# Patient Record
Sex: Male | Born: 1990 | Race: Black or African American | Hispanic: No | Marital: Married | Smoking: Never smoker
Health system: Southern US, Community
[De-identification: ages and names within clinical notes are randomized; demographics above are authoritative.]

## PROBLEM LIST (undated history)

## (undated) DIAGNOSIS — Z789 Other specified health status: Secondary | ICD-10-CM

## (undated) HISTORY — DX: Morbid (severe) obesity due to excess calories: E66.01

## (undated) HISTORY — DX: Other specified health status: Z78.9

---
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
## 2009-05-24 ENCOUNTER — Emergency Department (HOSPITAL_COMMUNITY): Admission: EM | Admit: 2009-05-24 | Discharge: 2009-05-25 | Payer: Self-pay | Admitting: Emergency Medicine

## 2009-05-26 ENCOUNTER — Emergency Department (HOSPITAL_COMMUNITY): Admission: EM | Admit: 2009-05-26 | Discharge: 2009-05-26 | Payer: Self-pay | Admitting: Emergency Medicine

## 2010-03-31 ENCOUNTER — Encounter: Admission: RE | Admit: 2010-03-31 | Discharge: 2010-03-31 | Payer: Self-pay | Admitting: General Practice

## 2010-05-03 IMAGING — CR DG CHEST 1V PORT
1 series · 1 of 1 positions shown · non-contrast
Comparison: Chest radiograph 06/08/2006

CLINICAL DATA: Pain and palpitations

PORTABLE CHEST - 1 VIEW

[view not recorded]
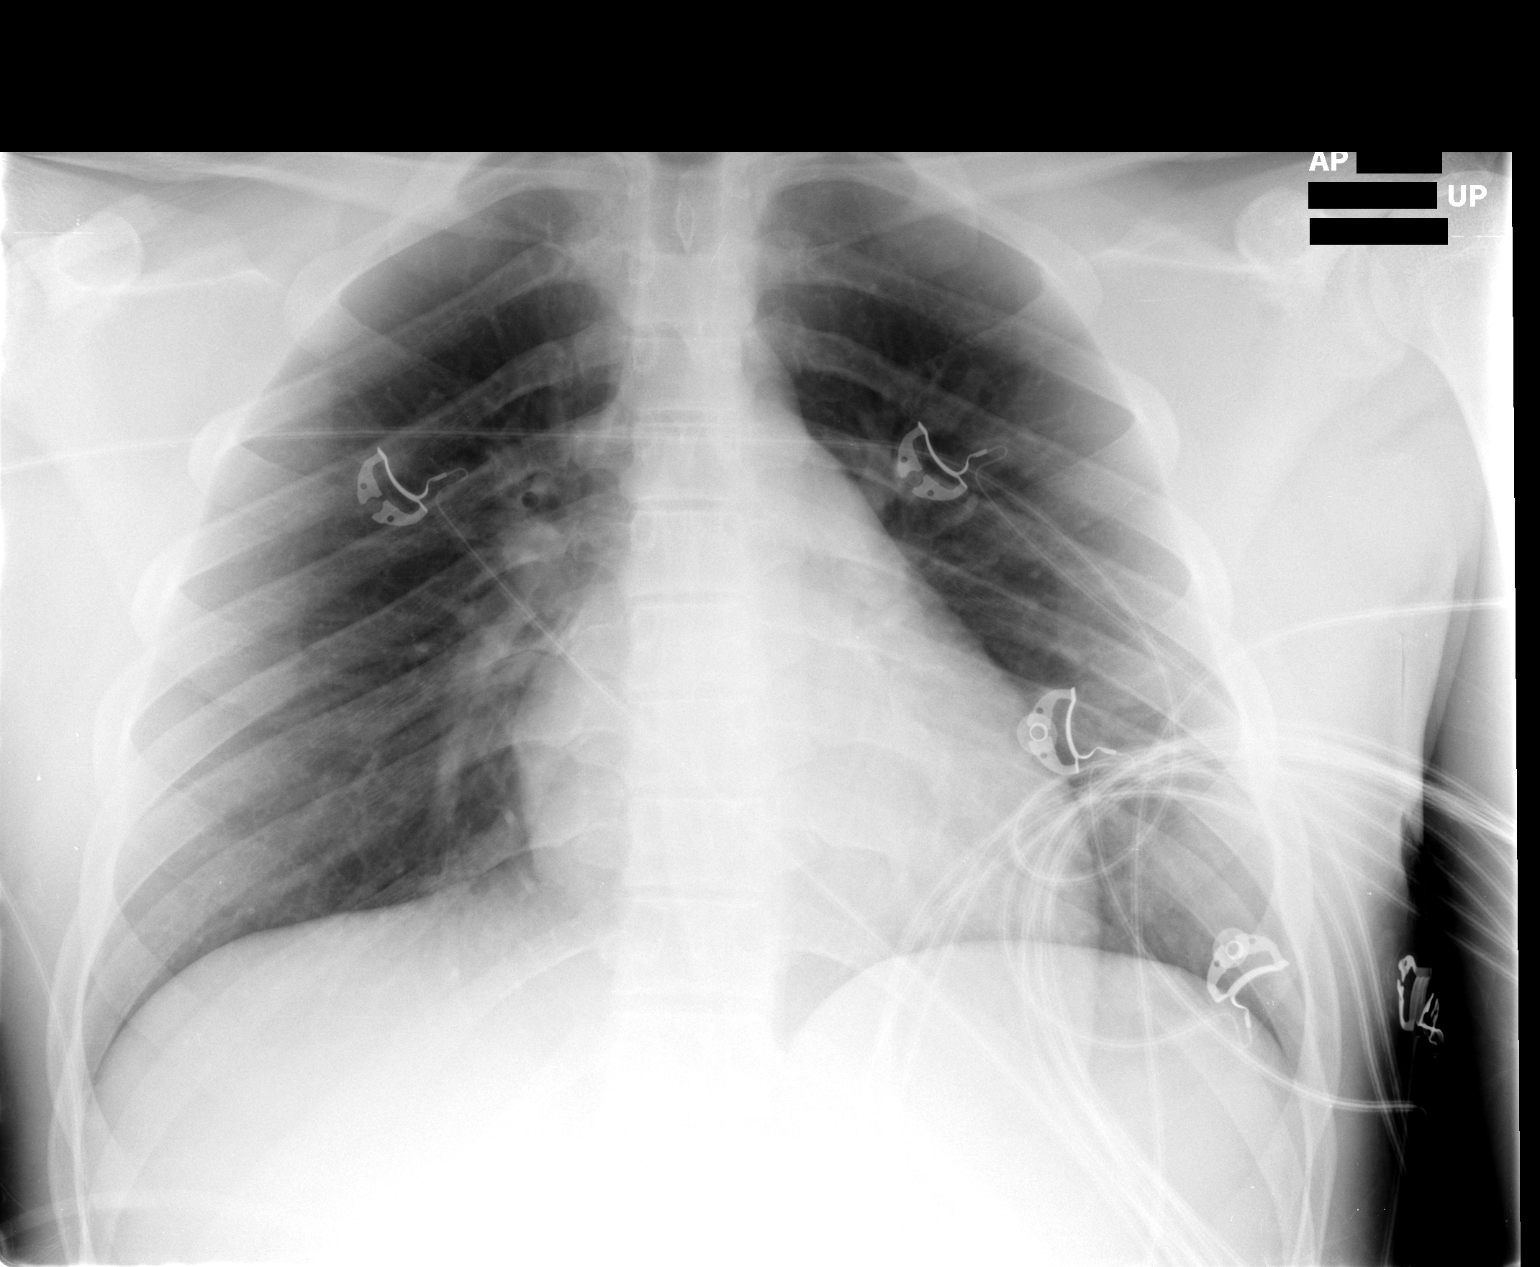

[1 of 1 positions shown; findings below may reference images not displayed]

FINDINGS: Normal mediastinum and cardiac silhouette.  Costophrenic
angles are clear.  No evidence effusion, infiltrate, or
pneumothorax.
IMPRESSION: No acute cardiopulmonary process.

## 2010-11-06 LAB — URINE CULTURE
Colony Count: NO GROWTH
Culture: NO GROWTH

## 2010-11-06 LAB — DIFFERENTIAL
Basophils Absolute: 0 10*3/uL (ref 0.0–0.1)
Basophils Relative: 0 % (ref 0–1)
Eosinophils Absolute: 0 10*3/uL (ref 0.0–0.7)
Eosinophils Relative: 0 % (ref 0–5)
Lymphocytes Relative: 28 % (ref 12–46)
Lymphs Abs: 2.2 10*3/uL (ref 0.7–4.0)
Monocytes Absolute: 0.7 10*3/uL (ref 0.1–1.0)
Monocytes Relative: 9 % (ref 3–12)
Neutro Abs: 5 10*3/uL (ref 1.7–7.7)
Neutrophils Relative %: 63 % (ref 43–77)

## 2010-11-06 LAB — URINALYSIS, ROUTINE W REFLEX MICROSCOPIC
Bilirubin Urine: NEGATIVE
Glucose, UA: NEGATIVE mg/dL
Hgb urine dipstick: NEGATIVE
Ketones, ur: NEGATIVE mg/dL
Ketones, ur: NEGATIVE mg/dL
Protein, ur: NEGATIVE mg/dL
Specific Gravity, Urine: 1.025 (ref 1.005–1.030)
Urobilinogen, UA: 1 mg/dL (ref 0.0–1.0)
pH: 7 (ref 5.0–8.0)

## 2010-11-06 LAB — COMPREHENSIVE METABOLIC PANEL
ALT: 24 U/L (ref 0–53)
Alkaline Phosphatase: 70 U/L (ref 39–117)
BUN: 8 mg/dL (ref 6–23)
Calcium: 9.1 mg/dL (ref 8.4–10.5)
GFR calc Af Amer: >60 mL/min (ref >60)
Glucose, Bld: 179 mg/dL — ABNORMAL HIGH (ref 70–99)
Potassium: 3 mEq/L — ABNORMAL LOW (ref 3.5–5.1)
Total Bilirubin: 0.5 mg/dL (ref 0.3–1.2)
Total Protein: 7.2 g/dL (ref 6.0–8.3)

## 2010-11-06 LAB — CBC
HCT: 42.1 % (ref 39.0–52.0)
Hemoglobin: 14.2 g/dL (ref 13.0–17.0)
MCHC: 33.7 g/dL (ref 30.0–36.0)
MCV: 86.8 fL (ref 78.0–100.0)
Platelets: 171 10*3/uL (ref 150–400)
RBC: 4.86 MIL/uL (ref 4.22–5.81)
RDW: 13.5 % (ref 11.5–15.5)
WBC: 7.9 10*3/uL (ref 4.0–10.5)

## 2010-11-06 LAB — POCT I-STAT, CHEM 8
BUN: 9 mg/dL (ref 6–23)
Calcium, Ion: 1.2 mmol/L (ref 1.12–1.32)
Chloride: 102 mEq/L (ref 96–112)
Creatinine, Ser: 1.2 mg/dL (ref 0.4–1.5)
Glucose, Bld: 93 mg/dL (ref 70–99)
HCT: 45 % (ref 39.0–52.0)
Hemoglobin: 15.3 g/dL (ref 13.0–17.0)
Potassium: 4 mEq/L (ref 3.5–5.1)
Sodium: 140 mEq/L (ref 135–145)
TCO2: 27 mmol/L (ref 0–100)

## 2010-11-06 LAB — RAPID URINE DRUG SCREEN, HOSP PERFORMED
Amphetamines: NOT DETECTED
Barbiturates: NOT DETECTED
Benzodiazepines: NOT DETECTED
Benzodiazepines: NOT DETECTED
Cocaine: NOT DETECTED
Cocaine: NOT DETECTED
Opiates: NOT DETECTED
Tetrahydrocannabinol: NOT DETECTED
Tetrahydrocannabinol: NOT DETECTED

## 2010-11-06 LAB — POCT CARDIAC MARKERS
CKMB, poc: <1 ng/mL — ABNORMAL LOW (ref 1.0–8.0)
CKMB, poc: <1 ng/mL — ABNORMAL LOW (ref 1.0–8.0)
CKMB, poc: <1 ng/mL — ABNORMAL LOW (ref 1.0–8.0)
Myoglobin, poc: 42.1 ng/mL (ref 12–200)
Myoglobin, poc: 51.5 ng/mL (ref 12–200)
Myoglobin, poc: 54.6 ng/mL (ref 12–200)
Troponin i, poc: <0.05 ng/mL (ref 0.00–0.09)
Troponin i, poc: <0.05 ng/mL (ref 0.00–0.09)
Troponin i, poc: <0.05 ng/mL (ref 0.00–0.09)

## 2010-11-06 LAB — ACETAMINOPHEN LEVEL: Acetaminophen (Tylenol), Serum: <10 ug/mL — ABNORMAL LOW (ref 10–30)

## 2010-11-06 LAB — ETHANOL: Alcohol, Ethyl (B): 57 mg/dL — ABNORMAL HIGH (ref 0–10)

## 2010-11-24 ENCOUNTER — Emergency Department (HOSPITAL_COMMUNITY)
Admission: EM | Admit: 2010-11-24 | Discharge: 2010-11-24 | Disposition: A | Discharge status: Home / Self Care | Payer: Self-pay | Attending: Emergency Medicine | Admitting: Emergency Medicine

## 2010-11-24 DIAGNOSIS — N342 Other urethritis: Secondary | ICD-10-CM | POA: Insufficient documentation

## 2010-11-24 DIAGNOSIS — R109 Unspecified abdominal pain: Secondary | ICD-10-CM | POA: Insufficient documentation

## 2010-11-24 DIAGNOSIS — R300 Dysuria: Secondary | ICD-10-CM | POA: Insufficient documentation

## 2010-11-24 LAB — URINALYSIS, ROUTINE W REFLEX MICROSCOPIC
Bilirubin Urine: NEGATIVE
Glucose, UA: NEGATIVE mg/dL
Ketones, ur: NEGATIVE mg/dL
Nitrite: NEGATIVE
Nitrite: NEGATIVE
Specific Gravity, Urine: 1.029 (ref 1.005–1.030)
Urobilinogen, UA: 0.2 mg/dL (ref 0.0–1.0)
pH: 6 (ref 5.0–8.0)
pH: 6 (ref 5.0–8.0)

## 2010-11-25 LAB — GC/CHLAMYDIA PROBE AMP, GENITAL: Chlamydia, DNA Probe: NEGATIVE

## 2010-11-29 ENCOUNTER — Emergency Department (HOSPITAL_COMMUNITY)
Admission: EM | Admit: 2010-11-29 | Discharge: 2010-11-29 | Disposition: A | Discharge status: Home / Self Care | Payer: Self-pay | Attending: Emergency Medicine | Admitting: Emergency Medicine

## 2010-11-29 DIAGNOSIS — R0989 Other specified symptoms and signs involving the circulatory and respiratory systems: Secondary | ICD-10-CM | POA: Insufficient documentation

## 2010-11-29 DIAGNOSIS — R0602 Shortness of breath: Secondary | ICD-10-CM | POA: Insufficient documentation

## 2010-11-29 DIAGNOSIS — Y929 Unspecified place or not applicable: Secondary | ICD-10-CM | POA: Insufficient documentation

## 2010-11-29 DIAGNOSIS — T370X5A Adverse effect of sulfonamides, initial encounter: Secondary | ICD-10-CM | POA: Insufficient documentation

## 2010-11-29 DIAGNOSIS — R0609 Other forms of dyspnea: Secondary | ICD-10-CM | POA: Insufficient documentation

## 2010-11-29 DIAGNOSIS — K117 Disturbances of salivary secretion: Secondary | ICD-10-CM | POA: Insufficient documentation

## 2011-01-28 ENCOUNTER — Emergency Department (HOSPITAL_COMMUNITY)
Admission: EM | Admit: 2011-01-28 | Discharge: 2011-01-28 | Disposition: A | Discharge status: Home / Self Care | Payer: Private Health Insurance - Indemnity | Attending: Emergency Medicine | Admitting: Emergency Medicine

## 2011-01-28 DIAGNOSIS — R195 Other fecal abnormalities: Secondary | ICD-10-CM | POA: Insufficient documentation

## 2011-01-28 DIAGNOSIS — K625 Hemorrhage of anus and rectum: Secondary | ICD-10-CM | POA: Insufficient documentation

## 2011-01-28 DIAGNOSIS — R1084 Generalized abdominal pain: Secondary | ICD-10-CM | POA: Insufficient documentation

## 2011-01-28 LAB — POCT I-STAT, CHEM 8
BUN: 10 mg/dL (ref 6–23)
Calcium, Ion: 1.25 mmol/L (ref 1.12–1.32)
Chloride: 103 mEq/L (ref 96–112)
Creatinine, Ser: 1.2 mg/dL (ref 0.50–1.35)
Glucose, Bld: 93 mg/dL (ref 70–99)
HCT: 51 % (ref 39.0–52.0)
Hemoglobin: 17.3 g/dL — ABNORMAL HIGH (ref 13.0–17.0)
TCO2: 29 mmol/L (ref 0–100)

## 2011-01-28 LAB — DIFFERENTIAL
Basophils Absolute: 0 10*3/uL (ref 0.0–0.1)
Basophils Relative: 0 % (ref 0–1)
Eosinophils Relative: 1 % (ref 0–5)
Monocytes Relative: 10 % (ref 3–12)
Neutro Abs: 4.7 10*3/uL (ref 1.7–7.7)
Neutrophils Relative %: 65 % (ref 43–77)

## 2011-01-28 LAB — URINALYSIS, ROUTINE W REFLEX MICROSCOPIC
Bilirubin Urine: NEGATIVE
Glucose, UA: NEGATIVE mg/dL
Leukocytes, UA: NEGATIVE
Specific Gravity, Urine: 1.023 (ref 1.005–1.030)
pH: 6.5 (ref 5.0–8.0)

## 2011-01-28 LAB — POTASSIUM: Potassium: 5.3 mEq/L — ABNORMAL HIGH (ref 3.5–5.1)

## 2011-01-28 LAB — CBC
MCH: 28.5 pg (ref 26.0–34.0)
MCHC: 33.9 g/dL (ref 30.0–36.0)
MCV: 84.2 fL (ref 78.0–100.0)
WBC: 7.1 10*3/uL (ref 4.0–10.5)

## 2011-01-28 LAB — OCCULT BLOOD, POC DEVICE: Fecal Occult Bld: NEGATIVE

## 2011-02-10 ENCOUNTER — Inpatient Hospital Stay (INDEPENDENT_AMBULATORY_CARE_PROVIDER_SITE_OTHER)
Admission: RE | Admit: 2011-02-10 | Discharge: 2011-02-10 | Disposition: A | Discharge status: Home / Self Care | Payer: 59 | Source: Ambulatory Visit | Attending: Emergency Medicine | Admitting: Emergency Medicine

## 2011-02-10 DIAGNOSIS — R6889 Other general symptoms and signs: Secondary | ICD-10-CM

## 2011-02-10 LAB — POCT I-STAT, CHEM 8
Creatinine, Ser: 1.2 mg/dL (ref 0.50–1.35)
Hemoglobin: 16.3 g/dL (ref 13.0–17.0)
Sodium: 140 mEq/L (ref 135–145)
TCO2: 28 mmol/L (ref 0–100)

## 2011-08-18 ENCOUNTER — Encounter (HOSPITAL_COMMUNITY): Payer: Self-pay | Admitting: Emergency Medicine

## 2011-08-18 ENCOUNTER — Emergency Department (HOSPITAL_COMMUNITY)
Admission: EM | Admit: 2011-08-18 | Discharge: 2011-08-18 | Disposition: A | Discharge status: Home / Self Care | Payer: 59 | Source: Home / Self Care | Attending: Family Medicine | Admitting: Family Medicine

## 2011-08-18 DIAGNOSIS — J029 Acute pharyngitis, unspecified: Secondary | ICD-10-CM

## 2011-08-18 DIAGNOSIS — R05 Cough: Secondary | ICD-10-CM

## 2011-08-18 DIAGNOSIS — R059 Cough, unspecified: Secondary | ICD-10-CM

## 2011-08-18 NOTE — ED Notes (Signed)
Sore throat, cough.  Onset 2 days ago

## 2011-08-18 NOTE — ED Provider Notes (Signed)
History     CSN: [id number]  Arrival date & time 08/18/11  1433   First MD Initiated Contact with Patient 08/18/11 1436      Chief Complaint  Patient presents with  . URI    (Consider location/radiation/quality/duration/timing/severity/associated sxs/prior treatment) Patient is a 21 y.o. male presenting with cough. The history is provided by the patient.  Cough This is a new problem. The current episode started more than 2 days ago. The problem occurs constantly. The problem has not changed since onset.The cough is non-productive. There has been no fever. Associated symptoms include chills, sweats and sore throat. Pertinent negatives include no shortness of breath. He is not a smoker.    History reviewed. No pertinent past medical history.  History reviewed. No pertinent past surgical history.  No family history on file.  History  Substance Use Topics  . Smoking status: Never Smoker   . Smokeless tobacco: Not on file  . Alcohol Use: Yes      Review of Systems  Constitutional: Positive for chills.  HENT: Positive for congestion and sore throat. Negative for trouble swallowing.   Eyes: Negative.   Respiratory: Positive for cough. Negative for shortness of breath.   Cardiovascular: Negative.   Gastrointestinal: Negative.   Musculoskeletal: Negative.   Skin: Negative.     Allergies  Review of patient's allergies indicates no known allergies.  Home Medications  No current outpatient prescriptions on file.  BP 128/66  Pulse 79  Temp(Src) 97.7 F (36.5 C) (Oral)  Resp 14  SpO2 97%  Physical Exam  Nursing note and vitals reviewed. Constitutional: He is oriented to person, place, and time. He appears well-developed and well-nourished.  HENT:  Head: Normocephalic and atraumatic.  Right Ear: Tympanic membrane normal.  Left Ear: Tympanic membrane normal.  Mouth/Throat: Uvula is midline, oropharynx is clear and moist and mucous membranes are normal.  Eyes: EOM  are normal.  Neck: Normal range of motion.  Cardiovascular: Normal rate, regular rhythm and normal heart sounds.   Pulmonary/Chest: Effort normal and breath sounds normal. He has no wheezes. He has no rales.  Musculoskeletal: Normal range of motion.  Neurological: He is alert and oriented to person, place, and time.  Skin: Skin is warm and dry.  Psychiatric: His behavior is normal.    ED Course  Procedures (including critical care time)  Labs Reviewed - No data to display No results found.   1. Pharyngitis   2. Cough       MDM  Viral pharyngitis; 0/4 Centor criteria        Richardo Priest, MD 08/18/11 1650

## 2012-10-20 ENCOUNTER — Encounter (HOSPITAL_COMMUNITY): Payer: Self-pay | Admitting: Emergency Medicine

## 2012-10-20 ENCOUNTER — Emergency Department (HOSPITAL_COMMUNITY)
Admission: EM | Admit: 2012-10-20 | Discharge: 2012-10-20 | Disposition: A | Discharge status: Home / Self Care | Payer: No Typology Code available for payment source | Source: Home / Self Care | Attending: Family Medicine | Admitting: Family Medicine

## 2012-10-20 DIAGNOSIS — R197 Diarrhea, unspecified: Secondary | ICD-10-CM

## 2012-10-20 MED ORDER — LOPERAMIDE HCL 2 MG PO CAPS: 2.0000 mg | ORAL_CAPSULE | Freq: Four times a day (QID) | ORAL | Status: DC | PRN

## 2012-10-20 MED ORDER — ONDANSETRON 4 MG PO TBDP: 4.0000 mg | ORAL_TABLET | Freq: Three times a day (TID) | ORAL | Status: DC | PRN

## 2012-10-20 NOTE — ED Notes (Signed)
MD at bedside. 

## 2012-10-20 NOTE — ED Notes (Signed)
Information sheet: "diarrhea". Onset"Tuesday".  Tried "pepto bismal"

## 2012-10-20 NOTE — ED Provider Notes (Signed)
History     CSN: [id number]  Arrival date & time 10/20/12  1135   First MD Initiated Contact with Patient 10/20/12 1155      Chief Complaint  Patient presents with  . Diarrhea    (Consider location/radiation/quality/duration/timing/severity/associated sxs/prior treatment) HPI Comments: 22 year old male with no significant past medical history. Comes complaining of watery diarrhea for 2 days. Patient reports his symptoms were preceded by general malaise, headache and chills. Denies current fever. Has had nausea but no vomiting. Also reports decrease appetite. Denies abdominal pain. No cough or congestion. No chest pain. Diarrhea is nonbloody and no mucus. No rash. 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 Also has a young child at home that goes to daycare without current sickness. In   History reviewed. No pertinent past medical history.  History reviewed. No pertinent past surgical history.  No family history on file.  History  Substance Use Topics  . Smoking status: Never Smoker   . Smokeless tobacco: Not on file  . Alcohol Use: Yes      Review of Systems  Constitutional: Negative for fever, chills, diaphoresis, appetite change and fatigue.  HENT: Negative for congestion.   Respiratory: Negative for cough.   Gastrointestinal: Positive for diarrhea. Negative for vomiting and abdominal pain.  Skin: Negative for rash.  Neurological: Negative for dizziness and headaches.  All other systems reviewed and are negative.    Allergies  Sulfur  Home Medications   Current Outpatient Rx  Name  Route  Sig  Dispense  Refill  . bismuth subsalicylate (PEPTO BISMOL) 262 MG/15ML suspension   Oral   Take 15 mLs by mouth every 6 (six) hours as needed for indigestion.         Marland Kitchen loperamide (IMODIUM) 2 MG capsule   Oral   Take 1 capsule (2 mg total) by mouth 4 (four) times daily as needed for diarrhea or loose stools.  12 capsule   0   . ondansetron (ZOFRAN-ODT) 4 MG disintegrating tablet   Oral   Take 1 tablet (4 mg total) by mouth every 8 (eight) hours as needed for nausea.   10 tablet   0     BP 123/56  Pulse 66  Temp(Src) 97.9 F (36.6 C) (Oral)  Resp 16  SpO2 100%  Physical Exam  Nursing note and vitals reviewed. Constitutional: He is oriented to person, place, and time. He appears well-developed and well-nourished. No distress.  HENT:  Head: Normocephalic and atraumatic.  Mouth/Throat: Oropharynx is clear and moist. No oropharyngeal exudate.  Eyes: Conjunctivae are normal. No scleral icterus.  Neck: No thyromegaly present.  Cardiovascular: Normal heart sounds.   Pulmonary/Chest: Breath sounds normal.  Abdominal: Soft. Bowel sounds are normal. He exhibits no distension and no mass. There is no tenderness. There is no rebound and no guarding.  Lymphadenopathy:    He has no cervical adenopathy.  Neurological: He is alert and oriented to person, place, and time.  Skin: No rash noted. He is not diaphoretic.    ED Course  Procedures (including critical care time)  Labs Reviewed - No data to display No results found.   1. Diarrhea       MDM  Impress viral enteritis. No signs of dehydration. Clinically well. Reassuring abdominal exam. Prescribed loperamide, ondansetron. Supportive care including hydration and red flags that should prompt his return to medical attention discussed with patient and provided in writing.  Sharin Grave, MD 10/20/12 1757

## 2014-09-06 ENCOUNTER — Encounter (HOSPITAL_COMMUNITY): Payer: Self-pay | Admitting: Emergency Medicine

## 2014-09-06 ENCOUNTER — Emergency Department (INDEPENDENT_AMBULATORY_CARE_PROVIDER_SITE_OTHER)
Admission: EM | Admit: 2014-09-06 | Discharge: 2014-09-06 | Disposition: A | Discharge status: Home / Self Care | Payer: 59 | Source: Home / Self Care | Attending: Family Medicine | Admitting: Family Medicine

## 2014-09-06 DIAGNOSIS — Z23 Encounter for immunization: Secondary | ICD-10-CM

## 2014-09-06 DIAGNOSIS — L739 Follicular disorder, unspecified: Secondary | ICD-10-CM

## 2014-09-06 MED ORDER — TETANUS-DIPHTH-ACELL PERTUSSIS 5-2.5-18.5 LF-MCG/0.5 IM SUSP
0.5000 mL | Freq: Once | INTRAMUSCULAR | Status: AC
  Administered 2014-09-06: 0.5 mL via INTRAMUSCULAR

## 2014-09-06 MED ORDER — TETANUS-DIPHTH-ACELL PERTUSSIS 5-2.5-18.5 LF-MCG/0.5 IM SUSP
INTRAMUSCULAR | Status: AC
Start: 2014-09-06 — End: 2014-09-06
  Filled 2014-09-06: qty 0.5

## 2014-09-06 MED ORDER — DOXYCYCLINE HYCLATE 100 MG PO CAPS: 100.0000 mg | ORAL_CAPSULE | Freq: Two times a day (BID) | ORAL | Status: DC

## 2014-09-06 NOTE — ED Provider Notes (Signed)
Matthew Waters is a 24 y.o. male who presents to Urgent Care today for hand infection. Patient notes redness and tenderness at the dorsal aspect of the right fifth digit of his hand present over the past 2 days worsening recently. He denies any injury. No fevers or chills nausea vomiting or diarrhea. He cannot recall his last tetanus vaccination.   History reviewed. No pertinent past medical history. History reviewed. No pertinent past surgical history. History  Substance Use Topics  . Smoking status: Never Smoker   . Smokeless tobacco: Not on file  . Alcohol Use: Yes   ROS as above Medications: No current facility-administered medications for this encounter.   Current Outpatient Prescriptions  Medication Sig Dispense Refill  . bismuth subsalicylate (PEPTO BISMOL) 262 MG/15ML suspension Take 15 mLs by mouth every 6 (six) hours as needed for indigestion.    Marland Kitchen. doxycycline (VIBRAMYCIN) 100 MG capsule Take 1 capsule (100 mg total) by mouth 2 (two) times daily. 14 capsule 0  . loperamide (IMODIUM) 2 MG capsule Take 1 capsule (2 mg total) by mouth 4 (four) times daily as needed for diarrhea or loose stools. 12 capsule 0  . ondansetron (ZOFRAN-ODT) 4 MG disintegrating tablet Take 1 tablet (4 mg total) by mouth every 8 (eight) hours as needed for nausea. 10 tablet 0   Allergies  Allergen Reactions  . Sulfur      Exam:  BP 109/58 mmHg  Pulse 57  Temp(Src) 98.2 F (36.8 C) (Oral)  Resp 16  SpO2 98% Gen: Well NAD Right hand normal-appearing with the exception of a small area of erythema and tenderness at the dorsal aspect of the right fifth digit at the proximal phalanx. Hand motion is normal. Pulses Refill sensation are intact.  No results found for this or any previous visit (from the past 24 hour(s)). No results found.  Assessment and Plan: 24 y.o. male with cellulitis of the right hand. Treat with doxycycline. Patient was given a Tdap Vaccine prior to discharge.  Discussed  warning signs or symptoms. Please see discharge instructions. Patient expresses understanding.     Matthew BongEvan S Almus Woodham, MD 09/06/14 [phone]

## 2014-09-06 NOTE — ED Notes (Signed)
Reports pain and mild swelling in right pinky finger since 2/2.  No known injury.  Unsure of last tetanus.

## 2014-09-06 NOTE — Discharge Instructions (Signed)
Thank you for coming in today. °Take doxycycline twice daily for one week °Return as needed ° ° °Folliculitis  °Folliculitis is redness, soreness, and swelling (inflammation) of the hair follicles. This condition can occur anywhere on the body. People with weakened immune systems, diabetes, or obesity have a greater risk of getting folliculitis. °CAUSES °· Bacterial infection. This is the most common cause. °· Fungal infection. °· Viral infection. °· Contact with certain chemicals, especially oils and tars. °Long-term folliculitis can result from bacteria that live in the nostrils. The bacteria may trigger multiple outbreaks of folliculitis over time. °SYMPTOMS °Folliculitis most commonly occurs on the scalp, thighs, legs, back, buttocks, and areas where hair is shaved frequently. An early sign of folliculitis is a small, white or yellow, pus-filled, itchy lesion (pustule). These lesions appear on a red, inflamed follicle. They are usually less than 0.2 inches (5 mm) wide. When there is an infection of the follicle that goes deeper, it becomes a boil or furuncle. A group of closely packed boils creates a larger lesion (carbuncle). Carbuncles tend to occur in hairy, sweaty areas of the body. °DIAGNOSIS  °Your caregiver can usually tell what is wrong by doing a physical exam. A sample may be taken from one of the lesions and tested in a lab. This can help determine what is causing your folliculitis. °TREATMENT  °Treatment may include: °· Applying warm compresses to the affected areas. °· Taking antibiotic medicines orally or applying them to the skin. °· Draining the lesions if they contain a large amount of pus or fluid. °· Laser hair removal for cases of long-lasting folliculitis. This helps to prevent regrowth of the hair. °HOME CARE INSTRUCTIONS °· Apply warm compresses to the affected areas as directed by your caregiver. °· If antibiotics are prescribed, take them as directed. Finish them even if you start to  feel better. °· You may take over-the-counter medicines to relieve itching. °· Do not shave irritated skin. °· Follow up with your caregiver as directed. °SEEK IMMEDIATE MEDICAL CARE IF:  °· You have increasing redness, swelling, or pain in the affected area. °· You have a fever. °MAKE SURE YOU: °· Understand these instructions. °· Will watch your condition. °· Will get help right away if you are not doing well or get worse. °Document Released: 09/28/2001 Document Revised: 01/19/2012 Document Reviewed: 10/20/2011 °[employer]® Patient Information ©2015 [employer]. This information is not intended to replace advice given to you by your health care provider. Make sure you discuss any questions you have with your health care provider. ° °

## 2015-03-08 ENCOUNTER — Ambulatory Visit (INDEPENDENT_AMBULATORY_CARE_PROVIDER_SITE_OTHER): Payer: 59 | Admitting: Family Medicine

## 2015-03-08 VITALS — BP 108/72 | HR 60 | Temp 97.7°F | Resp 16 | Ht 70.0 in | Wt 214.0 lb

## 2015-03-08 DIAGNOSIS — L03011 Cellulitis of right finger: Secondary | ICD-10-CM | POA: Diagnosis not present

## 2015-03-08 MED ORDER — DOXYCYCLINE HYCLATE 100 MG PO CAPS: 100.0000 mg | ORAL_CAPSULE | Freq: Two times a day (BID) | ORAL | Status: DC

## 2015-03-08 NOTE — Progress Notes (Signed)
[facility] [address], [place] [address] [phone]  Date:  03/08/2015   Name:  Matthew Waters   DOB:  [birth year]   MRN:  [id number]  PCP:  No primary care provider on file.    Chief Complaint: Finger Injury   History of Present Illness:  Matthew Waters is a 24 y.o. very pleasant male patient who presents with the following:  He hit his right long finger trying to swat a bee a week ago.  It seemed contused at first but not bad.  However over the last week it has become more swollend and tender- seems infected to him It is not draining He OW feels ok Generally healthy   There are no active problems to display for this patient.   History reviewed. No pertinent past medical history.  History reviewed. No pertinent past surgical history.  History  Substance Use Topics  . Smoking status: Never Smoker   . Smokeless tobacco: Not on file  . Alcohol Use: Yes    History reviewed. No pertinent family history.  Allergies  Allergen Reactions  . Sulfur     Medication list has been reviewed and updated.  Current Outpatient Prescriptions on File Prior to Visit  Medication Sig Dispense Refill  . bismuth subsalicylate (PEPTO BISMOL) 262 MG/15ML suspension Take 15 mLs by mouth every 6 (six) hours as needed for indigestion.    Marland Kitchen doxycycline (VIBRAMYCIN) 100 MG capsule Take 1 capsule (100 mg total) by mouth 2 (two) times daily. (Patient not taking: Reported on 03/08/2015) 14 capsule 0  . loperamide (IMODIUM) 2 MG capsule Take 1 capsule (2 mg total) by mouth 4 (four) times daily as needed for diarrhea or loose stools. (Patient not taking: Reported on 03/08/2015) 12 capsule 0  . ondansetron (ZOFRAN-ODT) 4 MG disintegrating tablet Take 1 tablet (4 mg total) by mouth every 8 (eight) hours as needed for nausea. (Patient not taking: Reported on 03/08/2015) 10 tablet 0   No current facility-administered medications on file prior to visit.    Review of  Systems:  As per HPI- otherwise negative.   Physical Examination: Filed Vitals:   03/08/15 1539  BP: 108/72  Pulse: 60  Temp: 97.7 F (36.5 C)  Resp: 16   Filed Vitals:   03/08/15 1539  Height:  (1.778 m)  Weight: 214 lb (97.07 kg)   Body mass index is 30.71 kg/(m^2). Ideal Body Weight: Weight in (lb) to have BMI = 25: 173.9   GEN: WDWN, NAD, Non-toxic, Alert & Oriented x 3, healthy appearing young man HEENT: Atraumatic, Normocephalic.  Ears and Nose: No external deformity. EXTR: No clubbing/cyanosis/edema NEURO: Normal gait.  PSYCH: Normally interactive. Conversant. Not depressed or anxious appearing.  Calm demeanor.  Right hand: there is a paronychia at the base of the long fingernail.  VC obtained.  Prepped area with alcohol.  I and D with needle, drained pus.  Dressed with band-aid  Assessment and Plan: Paronychia, right - Plan: doxycycline (VIBRAMYCIN) 100 MG capsule  Treated with I and D, doxycycline.  Warm compresses, follow- up if not better soon   Signed Abbe Amsterdam, MD

## 2015-03-08 NOTE — Patient Instructions (Signed)
Take the antibiotic as directed for your finger- take it with food!  Use some warm compresses to keep the pus draining from your finger Keep it covered with a band- aid in between warm soaks Let me know if not getting better soon!

## 2016-03-23 ENCOUNTER — Encounter: Payer: Self-pay | Admitting: Family Medicine

## 2016-03-23 ENCOUNTER — Ambulatory Visit (INDEPENDENT_AMBULATORY_CARE_PROVIDER_SITE_OTHER): Payer: BLUE CROSS/BLUE SHIELD | Admitting: Family Medicine

## 2016-03-23 VITALS — BP 140/80 | HR 60 | Resp 12 | Ht 70.0 in | Wt 237.1 lb

## 2016-03-23 DIAGNOSIS — E669 Obesity, unspecified: Secondary | ICD-10-CM | POA: Diagnosis not present

## 2016-03-23 DIAGNOSIS — Z6834 Body mass index (BMI) 34.0-34.9, adult: Secondary | ICD-10-CM

## 2016-03-23 DIAGNOSIS — M25561 Pain in right knee: Secondary | ICD-10-CM

## 2016-03-23 DIAGNOSIS — Z6836 Body mass index (BMI) 36.0-36.9, adult: Secondary | ICD-10-CM

## 2016-03-23 DIAGNOSIS — E66812 Obesity, class 2: Secondary | ICD-10-CM | POA: Insufficient documentation

## 2016-03-23 NOTE — Progress Notes (Signed)
Pre visit review using our clinic review tool, if applicable. No additional management support is needed unless otherwise documented below in the visit note. 

## 2016-03-23 NOTE — Patient Instructions (Addendum)
A few things to remember from today's visit:   Right knee pain - Plan: Ambulatory referral to Orthopedic Surgery  BMI 34.0-34.9,adult  Healthy eating, avoid frequent fast food.  Regular exercise to be resumed after ortho evaluation. Check blood pressure occasionally, goal for your age less than 140/90.  Please be sure medication list is accurate. If a new problem present, please set up appointment sooner than planned today.

## 2016-03-23 NOTE — Progress Notes (Signed)
HPI:   Matthew Waters is a 25 y.o. male, who is here today to establish care with me.  Former PCP: N/A. 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  Reporting Tdap within the past 2-3 years.   Concerns today: knee pain.  Right knee pain, intermittently, moderate. He started with R knee pain about 3 months ago after basketball game, he doesn't remember any injury but next day noted right knee swelling and pain. Pain is exacerbated by playing basketball, usually next day; it does not hurt unless he plays. He denies any limitation of ROM or limitation in daily activities.  He has not had knee evaluated. No erythema. + Intermittent knee edema. No other arthralgia, fever, or rash. He has not taken OTC medication for this problem.  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   Review of Systems  Constitutional: Negative for activity change, appetite change, fatigue, fever and unexpected weight change.  HENT: Negative for nosebleeds, sore throat and trouble swallowing.   Respiratory: Negative for cough, shortness of breath and wheezing.   Gastrointestinal: Negative for abdominal pain, nausea and vomiting.  Genitourinary: Negative for decreased urine volume and hematuria.  Musculoskeletal: Positive for arthralgias and joint swelling. Negative for back pain and myalgias.  Skin: Negative for color change and rash.  Neurological: Negative for dizziness, seizures, weakness, numbness and headaches.  Psychiatric/Behavioral: Negative for confusion and sleep disturbance. The patient is not nervous/anxious.       No current outpatient prescriptions on file prior to visit.   No current facility-administered medications on file prior to visit.      No past medical history on file. Allergies  Allergen Reactions  . Sulfur     Family History  Problem Relation  Age of Onset  . Colon cancer Neg Hx    Non contributory.  Social History   Social History  . Marital status: Single    Spouse name: N/A  . Number of children: N/A  . Years of education: N/A   Social History Main Topics  . Smoking status: Never Smoker  . Smokeless tobacco: Never Used  . Alcohol use Yes     Comment: occasionally  . Drug use: No  . Sexual activity: Not Asked   Other Topics Concern  . None   Social History Narrative  . None    Vitals:   03/23/16 1451  BP: 140/80  Pulse: 60  Resp: 12    Body mass index is 34.02 kg/m.    Physical Exam  Constitutional: He is oriented to person, place, and time. He appears well-developed. No distress.  HENT:  Head: Atraumatic.  Eyes: Conjunctivae and EOM are normal. Pupils are equal, round, and reactive to light.  Cardiovascular: Normal rate and regular rhythm.   No murmur heard. Pulses:      Dorsalis pedis pulses are 2+ on the right side, and 2+ on the left side.  Respiratory: Effort normal and breath sounds normal. No respiratory distress.  GI: Soft. He exhibits no mass. There is no hepatomegaly. There is no tenderness.  Musculoskeletal: He exhibits no edema.       Right knee: He exhibits effusion. He exhibits normal range of motion, no deformity and no erythema. No tenderness found.  Knee (R): on inspection no erythema or deformities. Valgus and varus stress normal, McMurray negative, anterior and posterior drawer test negative. Patellar apprehension test  negative. Mild joint effusion.  Neurological: He is alert and oriented to person, place, and time. He has normal strength. Coordination and gait normal.  Skin: Skin is warm. No erythema.  Psychiatric: He has a normal mood and affect.  Well groomed, good eye contact.      ASSESSMENT AND PLAN:     Matthew Waters was seen today for new patient (initial visit).  Diagnoses and all orders for this visit:  Right knee pain  ? Meniscal partial tear. Given the  time he has had problem I think ortho referral is warrant, he may need knee MRI and /or PT. Avoid activities that exacerbate problem, playing basketball, until ortho evaluation.  -     Ambulatory referral to Orthopedic Surgery  BMI 34.0-34.9,adult  We discussed benefits of wt loss as well as adverse effects of obesity. Consistency with healthy diet and physical activity recommended. Consider packing lunch and decreasing frequency of fast food intake.         Ziyana Morikawa G. [place]az[place], MD  [facility]. [place] office.

## 2017-01-12 ENCOUNTER — Encounter: Payer: Self-pay | Admitting: Family Medicine

## 2017-01-12 ENCOUNTER — Ambulatory Visit (INDEPENDENT_AMBULATORY_CARE_PROVIDER_SITE_OTHER): Payer: BLUE CROSS/BLUE SHIELD | Admitting: Family Medicine

## 2017-01-12 VITALS — BP 118/80 | HR 55 | Resp 12 | Ht 70.0 in | Wt 255.2 lb

## 2017-01-12 DIAGNOSIS — L219 Seborrheic dermatitis, unspecified: Secondary | ICD-10-CM | POA: Insufficient documentation

## 2017-01-12 DIAGNOSIS — Z6836 Body mass index (BMI) 36.0-36.9, adult: Secondary | ICD-10-CM

## 2017-01-12 DIAGNOSIS — R5382 Chronic fatigue, unspecified: Secondary | ICD-10-CM | POA: Diagnosis not present

## 2017-01-12 DIAGNOSIS — E6609 Other obesity due to excess calories: Secondary | ICD-10-CM | POA: Diagnosis not present

## 2017-01-12 DIAGNOSIS — R6882 Decreased libido: Secondary | ICD-10-CM

## 2017-01-12 MED ORDER — HYDROCORTISONE 1 % EX LOTN: 1.0000 "application " | TOPICAL_LOTION | Freq: Two times a day (BID) | CUTANEOUS | 1 refills | Status: DC | PRN

## 2017-01-12 MED ORDER — KETOCONAZOLE 2 % EX SHAM: 1.0000 "application " | MEDICATED_SHAMPOO | CUTANEOUS | 1 refills | Status: DC

## 2017-01-12 NOTE — Progress Notes (Signed)
HPI:   ACUTE VISIT:  Chief Complaint  Patient presents with  . Fatigue    [name] is a 26 y.o. male, who is here today complaining of fatigue, skin pruritus,and "peeling" skin.  He states that he has had these problems for a while and has been otherwise stable. He has noted scaly areas on face, mainly on sideburns and bear, intermittent pruritic rash on eye brows. Generalized pruritus, "everywhere", exacerbated after taking a shower , regardless of water temp. Occasionally erythematous rash. He has not identified alleviating factors.  He is not aware of any hx of eczema. He has not tried OTC treatments.   Rash  This is a recurrent problem. The current episode started more than 1 year ago. The problem has been waxing and waning since onset. The rash is diffuse. The rash is characterized by itchiness, scaling and redness. Associated symptoms include fatigue. Pertinent negatives include no congestion, cough, diarrhea, eye pain, facial edema, fever, joint pain, nail changes, shortness of breath, sore throat or vomiting. Past treatments include nothing. There is no history of allergies, asthma or eczema.  Erectile Dysfunction  This is a recurrent problem. The current episode started more than 1 year ago. The problem has been waxing and waning since onset. The nature of his difficulty is maintaining erection. Non-physiologic factors contributing to erectile dysfunction are anxiety and a decreased libido. He reports his erection duration to be 5 to 10 minutes. Irritative symptoms do not include frequency, nocturia or urgency. Obstructive symptoms do not include dribbling or a slower stream. Pertinent negatives include no chills, dysuria, genital pain or hematuria. Past treatments include nothing. There are no known risk factors.   He is also c/o "at least" 1-2 years of fatigue.  He sleeps well in general but does not feel rested when he gets up. His wife has mention louder  snoring, not sure about apnea.  He has noted wt gain. He is active, plays basketball 2-3 times per week and wt lifting. But has not been consistent with a healthy diet.  Chronic headache, noted when he wakes up, parietal that resolves spontaneously once he gets up and starts getting ready for work. + Decreased libido and problems with erections. He states that he feels like his erections are "weak." He denies nipple discharge.  Denies dysuria,increased urinary frequency, gross hematuria,or decreased urine output.  He denies Hx of depression or anxiety but reports episodes of "jittery" sensation and "heart feeling funny", "every blue moon." He denies crying spells or suicidal thoughts. Sometimes he does not feel "inspired" or motivated.    Review of Systems  Constitutional: Positive for fatigue. Negative for activity change, appetite change, chills, fever and unexpected weight change.  HENT: Negative for congestion, nosebleeds, sore throat and trouble swallowing.   Eyes: Negative for pain, redness and visual disturbance.  Respiratory: Negative for apnea, cough, shortness of breath and wheezing.   Cardiovascular: Negative for chest pain, palpitations and leg swelling.  Gastrointestinal: Negative for abdominal pain, diarrhea, nausea and vomiting.  Endocrine: Negative for cold intolerance, heat intolerance, polydipsia, polyphagia and polyuria.  Genitourinary: Positive for decreased libido. Negative for decreased urine volume, dysuria, frequency, hematuria, nocturia and urgency.  Musculoskeletal: Negative for joint pain.  Skin: Negative for nail changes, rash and wound.  Neurological: Positive for headaches. Negative for dizziness, seizures, syncope and weakness.  Psychiatric/Behavioral: Negative for confusion, sleep disturbance and suicidal ideas. The patient is nervous/anxious.     No current outpatient prescriptions on file prior  to visit.   No current facility-administered medications  on file prior to visit.     Past Medical History:  Diagnosis Date  . Medical history non-contributory    Allergies  Allergen Reactions  . Sulfur     Social History   Social History  . Marital status: Single    Spouse name: N/A  . Number of children: N/A  . Years of education: N/A   Social History Main Topics  . Smoking status: Never Smoker  . Smokeless tobacco: Never Used  . Alcohol use Yes     Comment: occasionally  . Drug use: No  . Sexual activity: Not Asked   Other Topics Concern  . None   Social History Narrative  . None    Vitals:   01/12/17 1525  BP: 118/80  Pulse: (!) 55  Resp: 12  O2 sat at RA 95% Body mass index is 36.62 kg/m.  Wt Readings from Last 3 Encounters:  01/12/17 255 lb 4 oz (115.8 kg)  03/23/16 237 lb 2 oz (107.6 kg)  03/08/15 214 lb (97.1 kg)     Physical Exam  Nursing note and vitals reviewed. Constitutional: He is oriented to person, place, and time. He appears well-developed. No distress.  HENT:  Head: Atraumatic.  Mouth/Throat: Oropharynx is clear and moist and mucous membranes are normal.  Eyes: Conjunctivae and EOM are normal. Pupils are equal, round, and reactive to light.  Neck: No tracheal deviation present. No thyroid mass and no thyromegaly present.  Cardiovascular: Regular rhythm.  Bradycardia present.   No murmur heard. Pulses:      Dorsalis pedis pulses are 2+ on the right side, and 2+ on the left side.  Respiratory: Effort normal and breath sounds normal. No respiratory distress.  GI: Soft. He exhibits no mass. There is no hepatomegaly. There is no tenderness.  Musculoskeletal: He exhibits no edema or tenderness.  Lymphadenopathy:    He has no cervical adenopathy.  Neurological: He is alert and oriented to person, place, and time. He has normal strength. Coordination and gait normal.  Skin: Skin is warm. No rash noted. No erythema.  Fine scaly areas on preauricular areas, in between and medial aspect of  eyebrows. Also naso labial folds. No skin rash or erythema appreciated.  Psychiatric: His mood appears anxious.  Well groomed, good eye contact.    ASSESSMENT AND PLAN:   Matthew SalkDesmond was seen today for fatigue.  Diagnoses and all orders for this visit:  Chronic fatigue  We discussed possible etiologies. Further recommendations will be given according to lab results. Healthy diet may help. Sleep study to be considered if work-up negative and not improvement with wt loss.  -     Testosterone; Future -     Basic metabolic panel; Future -     TSH; Future -     CBC; Future  Seborrheic dermatitis, unspecified  Educated about Dx and prognosis. Recommend topical steroid and ketoconazole shampoo. F/U in 3 months.  -     ketoconazole (NIZORAL) 2 % shampoo; Apply 1 application topically 2 (two) times a week. -     hydrocortisone 1 % lotion; Apply 1 application topically 2 (two) times daily as needed for itching.  Class 2 obesity due to excess calories without serious comorbidity with body mass index (BMI) of 36.0 to 36.9 in adult  Gained about 18 Lb since 03/2016. We discussed benefits of wt loss as well as adverse effects of obesity. Consistency with healthy diet and physical activity  recommended. Healthy and portion control recommended, continue regular exercise.  Decreased libido  Psychiatric,metabolic,and hormonal etiologies among some discussed. Further recommendations will be given according to lab results.   -     Testosterone; Future -     TSH; Future     Return in about 3 months (around 04/14/2017) for wt,fatigue.     Betty G. [place], MD  [facility]. [place] [facility].

## 2017-01-12 NOTE — Patient Instructions (Signed)
A few things to remember from today's visit:   Seborrheic dermatitis, unspecified - Plan: ketoconazole (NIZORAL) 2 % shampoo, hydrocortisone 1 % lotion  Class 2 obesity due to excess calories without serious comorbidity with body mass index (BMI) of 36.0 to 36.9 in adult  Chronic fatigue - Plan: Testosterone, Basic metabolic panel, TSH, CBC  Decreased libido - Plan: Testosterone, TSH   Labs: Fasting, next week.   What are some tips for weight loss? People become overweight for many reasons. Weight issues can run in families. They can be caused by unhealthy behaviors and a person's environment. Certain health problems and medicines can also lead to weight gain. There are some simple things you can do to reach and maintain a healthy weight:  Eat small more frequent healthy meals instead 3 bid meals. Also Weight Watchers is a good option. Avoid sweet drinks. These include regular soft drinks, fruit juices, fruit drinks, energy drinks, sweetened iced tea, and flavored milk. Avoid fast foods. Fast foods such as french fries, hamburgers, chicken nuggets, and pizza are high in calories and can cause weight gain. Eat a healthy breakfast. People who skip breakfast tend to weigh more. Don't watch more than two hours of television per day. Chew sugar-free gum between meals to cut down on snacking. Avoid grocery shopping when you're hungry. Pack a healthy lunch instead of eating out to control what and how much you eat. Eat a lot of fruits and vegetables. Aim for about 2 cups of fruit and 2 to 3 cups of vegetables per day. Aim for 150 minutes per week of moderate-intensity exercise (such as brisk walking), or 75 minutes per week of vigorous exercise (such as jogging or running). OR 15-30 min of daily brisk walking. Be more active. Small changes in physical activity can easily be added to your daily routine. For example, take the stairs instead of the elevator. Take a walk with your family. A daily  walk is a great way to get exercise and to catch up on the day's events.   Please be sure medication list is accurate. If a new problem present, please set up appointment sooner than planned today.

## 2017-01-16 ENCOUNTER — Encounter: Payer: Self-pay | Admitting: Family Medicine

## 2017-01-19 ENCOUNTER — Other Ambulatory Visit: Payer: BLUE CROSS/BLUE SHIELD

## 2017-01-20 ENCOUNTER — Other Ambulatory Visit (INDEPENDENT_AMBULATORY_CARE_PROVIDER_SITE_OTHER): Payer: BLUE CROSS/BLUE SHIELD

## 2017-01-20 DIAGNOSIS — R5382 Chronic fatigue, unspecified: Secondary | ICD-10-CM | POA: Diagnosis not present

## 2017-01-20 DIAGNOSIS — R6882 Decreased libido: Secondary | ICD-10-CM | POA: Diagnosis not present

## 2017-01-20 LAB — BASIC METABOLIC PANEL
BUN: 14 mg/dL (ref 6–23)
CHLORIDE: 104 meq/L (ref 96–112)
CO2: 28 meq/L (ref 19–32)
Calcium: 9.4 mg/dL (ref 8.4–10.5)
Creatinine, Ser: 1.26 mg/dL (ref 0.40–1.50)
GFR: 87.97 mL/min (ref >60.00)
Glucose, Bld: 97 mg/dL (ref 70–99)
POTASSIUM: 4.6 meq/L (ref 3.5–5.1)
Sodium: 138 mEq/L (ref 135–145)

## 2017-01-20 LAB — CBC
HEMATOCRIT: 45.5 % (ref 39.0–52.0)
HEMOGLOBIN: 14.8 g/dL (ref 13.0–17.0)
MCHC: 32.5 g/dL (ref 30.0–36.0)
MCV: 85.7 fl (ref 78.0–100.0)
PLATELETS: 194 10*3/uL (ref 150.0–400.0)
RBC: 5.31 Mil/uL (ref 4.22–5.81)
RDW: 13.5 % (ref 11.5–15.5)
WBC: 4.2 10*3/uL (ref 4.0–10.5)

## 2017-01-20 LAB — TESTOSTERONE: TESTOSTERONE: 363.62 ng/dL (ref 300.00–890.00)

## 2017-01-20 LAB — TSH: TSH: 1.24 u[IU]/mL (ref 0.35–4.50)

## 2017-01-21 LAB — PROLACTIN: PROLACTIN: 8.4 ng/mL (ref 2.0–18.0)

## 2017-04-14 ENCOUNTER — Ambulatory Visit: Payer: BLUE CROSS/BLUE SHIELD | Admitting: Family Medicine

## 2017-10-11 ENCOUNTER — Ambulatory Visit (HOSPITAL_COMMUNITY)
Admission: EM | Admit: 2017-10-11 | Discharge: 2017-10-11 | Disposition: A | Discharge status: Home / Self Care | Payer: BLUE CROSS/BLUE SHIELD | Attending: Family Medicine | Admitting: Family Medicine

## 2017-10-11 ENCOUNTER — Encounter (HOSPITAL_COMMUNITY): Payer: Self-pay | Admitting: Emergency Medicine

## 2017-10-11 ENCOUNTER — Other Ambulatory Visit: Payer: Self-pay

## 2017-10-11 DIAGNOSIS — B9789 Other viral agents as the cause of diseases classified elsewhere: Secondary | ICD-10-CM | POA: Diagnosis not present

## 2017-10-11 DIAGNOSIS — J069 Acute upper respiratory infection, unspecified: Secondary | ICD-10-CM | POA: Diagnosis not present

## 2017-10-11 NOTE — ED Provider Notes (Signed)
[facility]    CSN: [id number] Arrival date & time: 10/11/17  1434     History   Chief Complaint Chief Complaint  Patient presents with  . Cough    HPI Matthew Waters is a 27 y.o. male.   Matthew Waters presents with complaints of cough, congestion which started two days ago. States originally had chills, body aches and headache which have since improved. Denies sore throat, ear pain gi/gu complaints. No further fevers. Son also ill. Eating and drinking normally. Took theraflu and muxinex d this morning at 0200 which helped with symptoms. No asthma history. Without contributing medical history.    ROS per HPI.       Past Medical History:  Diagnosis Date  . Medical history non-contributory     Patient Active Problem List   Diagnosis Date Noted  . Chronic fatigue 01/12/2017  . Seborrheic dermatitis, unspecified 01/12/2017  . Class 2 obesity with body mass index (BMI) of 36.0 to 36.9 in adult 03/23/2016    History reviewed. No pertinent surgical history.     Home Medications    Prior to Admission medications   Medication Sig Start Date End Date Taking? Authorizing Provider  Phenylephrine-Pheniramine-DM [facility] COLD & COUGH PO) Take by mouth.   Yes [provider]  Pseudoephedrine-Guaifenesin (MUCINEX D PO) Take by mouth.   Yes [provider]  hydrocortisone 1 % lotion Apply 1 application topically 2 (two) times daily as needed for itching. 01/12/17   [place], Betty G, MD  ketoconazole (NIZORAL) 2 % shampoo Apply 1 application topically 2 (two) times a week. 01/14/17   [place], Betty G, MD    Family History Family History  Problem Relation Age of Onset  . Colon cancer Neg Hx     Social History Social History   Tobacco Use  . Smoking status: Never Smoker  . Smokeless tobacco: Never Used  Substance Use Topics  . Alcohol use: Yes    Comment: occasionally  . Drug use: No     Allergies   Sulfur   Review of Systems Review of  Systems   Physical Exam Triage Vital Signs ED Triage Vitals  Enc Vitals Group     BP 10/11/17 1509 122/65     Pulse Rate 10/11/17 1509 84     Resp 10/11/17 1509 (!) 22     Temp 10/11/17 1509 99 F (37.2 C)     Temp Source 10/11/17 1509 Oral     SpO2 10/11/17 1509 99 %     Weight --      Height --      Head Circumference --      Peak Flow --      Pain Score 10/11/17 1507 7     Pain Loc --      Pain Edu? --      Excl. in GC? --    No data found.  Updated Vital Signs BP 122/65 (BP Location: Left Arm) Comment: large cuff  Pulse 84   Temp 99 F (37.2 C) (Oral)   Resp (!) 22   SpO2 99%   Visual Acuity Right Eye Distance:   Left Eye Distance:   Bilateral Distance:    Right Eye Near:   Left Eye Near:    Bilateral Near:     Physical Exam  Constitutional: He is oriented to person, place, and time. He appears well-developed and well-nourished.  HENT:  Head: Normocephalic and atraumatic.  Right Ear: Tympanic membrane, external ear and ear canal  normal.  Left Ear: Tympanic membrane, external ear and ear canal normal.  Nose: Rhinorrhea present. Right sinus exhibits no maxillary sinus tenderness and no frontal sinus tenderness. Left sinus exhibits no maxillary sinus tenderness and no frontal sinus tenderness.  Mouth/Throat: Uvula is midline, oropharynx is clear and moist and mucous membranes are normal.  Eyes: Conjunctivae are normal. Pupils are equal, round, and reactive to light.  Neck: Normal range of motion.  Cardiovascular: Normal rate and regular rhythm.  Pulmonary/Chest: Effort normal and breath sounds normal.  Lymphadenopathy:    He has no cervical adenopathy.  Neurological: He is alert and oriented to person, place, and time.  Skin: Skin is warm and dry.  Vitals reviewed.    UC Treatments / Results  Labs (all labs ordered are listed, but only abnormal results are displayed) Labs Reviewed - No data to display  EKG  EKG Interpretation None        Radiology No results found.  Procedures Procedures (including critical care time)  Medications Ordered in UC Medications - No data to display   Initial Impression / Assessment and Plan / UC Course  I have reviewed the triage vital signs and the nursing notes.  Pertinent labs & imaging results that were available during my care of the patient were reviewed by me and considered in my medical decision making (see chart for details).     Benign physical findings. Non toxic in appearance. Afebrile. Without tachycardia or tachypnea during exam. History and physical consistent with viral illness.  Supportive cares recommended. Patient verbalized understanding and agreeable to plan.    Final Clinical Impressions(s) / UC Diagnoses   Final diagnoses:  Viral URI with cough    ED Discharge Orders    None       Controlled Substance Prescriptions [place] Controlled Substance Registry consulted? Not Applicable   Georgetta HaberBurky, Natalie B, NP 10/11/17 1559

## 2017-10-11 NOTE — ED Triage Notes (Signed)
Complains of cough, chest congestion, cold chills-burning in chest, headache-started saturday

## 2017-10-11 NOTE — Discharge Instructions (Signed)
Push fluids to ensure adequate hydration and keep secretions thin.  Tylenol and/or ibuprofen as needed for pain or fevers.  Continue with over the counter treatments as you have been taking. If symptoms worsen or do not improve in the next week to return to be seen or to follow up with your PCP.

## 2018-01-13 ENCOUNTER — Encounter

## 2018-01-18 ENCOUNTER — Encounter: Payer: Self-pay | Admitting: *Deleted

## 2018-01-18 ENCOUNTER — Encounter: Payer: Self-pay | Admitting: Family Medicine

## 2018-01-18 ENCOUNTER — Ambulatory Visit (INDEPENDENT_AMBULATORY_CARE_PROVIDER_SITE_OTHER): Payer: BLUE CROSS/BLUE SHIELD | Admitting: Family Medicine

## 2018-01-18 VITALS — BP 120/78 | HR 66 | Temp 98.1°F | Resp 12 | Ht 70.0 in | Wt 255.5 lb

## 2018-01-18 DIAGNOSIS — T148XXA Other injury of unspecified body region, initial encounter: Secondary | ICD-10-CM | POA: Diagnosis not present

## 2018-01-18 DIAGNOSIS — M545 Low back pain, unspecified: Secondary | ICD-10-CM

## 2018-01-18 MED ORDER — METHOCARBAMOL 500 MG PO TABS: 500.0000 mg | ORAL_TABLET | Freq: Three times a day (TID) | ORAL | 0 refills | Status: AC | PRN

## 2018-01-18 MED ORDER — DICLOFENAC SODIUM 75 MG PO TBEC: 75.0000 mg | DELAYED_RELEASE_TABLET | Freq: Two times a day (BID) | ORAL | 0 refills | Status: AC | PRN

## 2018-01-18 MED ORDER — KETOROLAC TROMETHAMINE 60 MG/2ML IM SOLN
60.0000 mg | Freq: Once | INTRAMUSCULAR | Status: AC
  Administered 2018-01-18: 60 mg via INTRAMUSCULAR

## 2018-01-18 NOTE — Patient Instructions (Signed)
A few things to remember from today's visit:   Acute right-sided low back pain without sciatica - Plan: diclofenac (VOLTAREN) 75 MG EC tablet, methocarbamol (ROBAXIN) 500 MG tablet  Muscle strain - Plan: diclofenac (VOLTAREN) 75 MG EC tablet, methocarbamol (ROBAXIN) 500 MG tablet    Back pain is very common in adults.The cause of back pain is rarely dangerous and the pain often gets better over time even with no pharmacologic treatment.  The cause of your back pain may not be known. Some common causes of back pain include: 1. Strain of the muscles or ligaments supporting the spine. 2. Wear and tear (degeneration) of the spinal disks. 3. Arthritis. 4. Direct injury to the back.  For many people, back pain may return. Since back pain is rarely dangerous, most people can learn to manage this condition on their own.  HOME CARE INSTRUCTIONS Watch your back pain for any changes. The following actions may help to lessen any discomfort you are feeling:  1. Remain active. It is stressful on your back to sit or stand in one place for long periods of time. Do not sit, drive, or stand in one place for more than 30 minutes at a time. Take short walks on even surfaces as soon as you are able.Try to increase the length of time you walk each day.  2. Exercise regularly as directed by your health care provider. Exercise helps your back heal faster. It also helps avoid future injury by keeping your muscles strong and flexible.  3. Do not stay in bed.Resting more than 1-2 days can delay your recovery.                                                      4. Pay attention to your body when you bend and lift. The most comfortable positions are those that put less stress on your recovering back.  5.  Always use proper lifting techniques, including: Bending your knees. Keeping the load close to your body. Avoiding twisting.  6. Find a comfortable position to sleep. Use a firm mattress and lie on your  side with your knees slightly bent. If you lie on your back, put a pillow under your knees.  7. Over the counter rubbing medications like Icy Hot or Asper cream with Lidocaine may help without significant side effects.  Acetaminophen and/or Aleve/Ibuprofen can be taken if needed and if not contraindications. Local ice and heat may be alternated to reduce pain and spasms. Also massage and even chiropractor treatment.      Muscle relaxants might or might not help, they cause drowsiness among other    side effects. They could also interact with some of medications you may be already taking (medications for depression/anxiety and some pain medications).   8. Maintain a healthy weight. Excess weight puts extra stress on your back and makes it difficult to maintain good posture.   SEEK MEDICAL CARE IF: worsening pain, associated fever, rash/edema on area, pain going to legs or buttocks, numbness/tingling, night pain, or abnormal weight loss.    SEEK IMMEDIATE MEDICAL CARE IF:  1. You develop new bowel or bladder control problems. 2. You have unusual weakness or numbness in your arms or legs. 3. You develop nausea or vomiting. 4. You develop abdominal pain. 5. You feel faint.  Back Exercises The following exercises strengthen the muscles that help to support the back. They also help to keep the lower back flexible. Doing these exercises can help to prevent back pain or lessen existing pain. If you have back pain or discomfort, try doing these exercises 2-3 times each day or as told by your health care provider. When the pain goes away, do them once each day, but increase the number of times that you repeat the steps for each exercise (do more repetitions). If you do not have back pain or discomfort, do these exercises once each day or as told by your health care provider.   EXERCISES Single Knee to Chest Repeat these steps 3-5 times for each leg: 5. Lie on your back on a firm bed or the  floor with your legs extended. 6. Bring one knee to your chest. Your other leg should stay extended and in contact with the floor. 7. Hold your knee in place by grabbing your knee or thigh. 8. Pull on your knee until you feel a gentle stretch in your lower back. 9. Hold the stretch for 10-30 seconds. 10. Slowly release and straighten your leg.  Pelvic Tilt Repeat these steps 5-10 times: 2. Lie on your back on a firm bed or the floor with your legs extended. 3. Bend your knees so they are pointing toward the ceiling and your feet are flat on the floor. 4. Tighten your lower abdominal muscles to press your lower back against the floor. This motion will tilt your pelvis so your tailbone points up toward the ceiling instead of pointing to your feet or the floor. 5. With gentle tension and even breathing, hold this position for 5-10 seconds.  Cat-Cow Repeat these steps until your lower back becomes more flexible: 1. Get into a hands-and-knees position on a firm surface. Keep your hands under your shoulders, and keep your knees under your hips. You may place padding under your knees for comfort. 2. Let your head hang down, and point your tailbone toward the floor so your lower back becomes rounded like the back of a cat. 3. Hold this position for 5 seconds. 4. Slowly lift your head and point your tailbone up toward the ceiling so your back forms a sagging arch like the back of a cow. 5. Hold this position for 5 seconds.   Press-Ups Repeat these steps 5-10 times: 6. Lie on your abdomen (face-down) on the floor. 7. Place your palms near your head, about shoulder-width apart. 8. While you keep your back as relaxed as possible and keep your hips on the floor, slowly straighten your arms to raise the top half of your body and lift your shoulders. Do not use your back muscles to raise your upper torso. You may adjust the placement of your hands to make yourself more comfortable. 9. Hold this position  for 5 seconds while you keep your back relaxed. 10. Slowly return to lying flat on the floor.   Bridges Repeat these steps 10 times: 1. Lie on your back on a firm surface. 2. Bend your knees so they are pointing toward the ceiling and your feet are flat on the floor. 3. Tighten your buttocks muscles and lift your buttocks off of the floor until your waist is at almost the same height as your knees. You should feel the muscles working in your buttocks and the back of your thighs. If you do not feel these muscles, slide your feet 1-2 inches farther away  from your buttocks. 4. Hold this position for 3-5 seconds. 5. Slowly lower your hips to the starting position, and allow your buttocks muscles to relax completely. If this exercise is too easy, try doing it with your arms crossed over your chest.     Please be sure medication list is accurate. If a new problem present, please set up appointment sooner than planned today.

## 2018-01-18 NOTE — Progress Notes (Signed)
ACUTE VISIT   HPI:  Chief Complaint  Patient presents with  . Back Pain    lower back, ride side, hurt while playing basketball    Matthew Waters is a 27 y.o. male, who is here today complaining of 3 days of right lower back pain. Pain started while he was playing basketball, he felt like he "twit" his back.  He was able to continue playing but ended up leaving the game due to worsening pain. He denies prior history of back pain. Pain is sharp and achy, occasionally he feels a burning sensation.  Pain is intermittent, it can be 10/10 in the morning with first gets up. He is exacerbated by movement and alleviated by rest.  Pain is not radiated. Negative for lower extremity numbness, tingling, or weakness. No saddle anesthesia or bowel/urine incontinence.  Pain is slowly getting better. He is taking OTC Aleve, last dose today at 1 AM.  Negative for fever, chills, local edema or erythema, or fatigue.  His job entails heavy lifting, he has to load and unload trucks.    Review of Systems  Constitutional: Negative for appetite change, chills, fatigue and fever.  Gastrointestinal: Negative for abdominal pain, nausea and vomiting.       No changes in bowel habits.  Genitourinary: Negative for decreased urine volume, dysuria, hematuria and urgency.  Musculoskeletal: Positive for back pain. Negative for neck pain.  Skin: Negative for color change and rash.  Neurological: Negative for weakness, numbness and headaches.  Psychiatric/Behavioral: Negative for confusion. The patient is not nervous/anxious.       Current Outpatient Medications on File Prior to Visit  Medication Sig Dispense Refill  . hydrocortisone 1 % lotion Apply 1 application topically 2 (two) times daily as needed for itching. 118 mL 1  . ketoconazole (NIZORAL) 2 % shampoo Apply 1 application topically 2 (two) times a week. 120 mL 1   No current facility-administered medications on file prior to  visit.      Past Medical History:  Diagnosis Date  . Medical history non-contributory    Allergies  Allergen Reactions  . Sulfa Antibiotics Rash  . Sulfur     Social History   Socioeconomic History  . Marital status: Single    Spouse name: Not on file  . Number of children: Not on file  . Years of education: Not on file  . Highest education level: Not on file  Occupational History  . Not on file  Social Needs  . Financial resource strain: Not on file  . Food insecurity:    Worry: Not on file    Inability: Not on file  . Transportation needs:    Medical: Not on file    Non-medical: Not on file  Tobacco Use  . Smoking status: Never Smoker  . Smokeless tobacco: Never Used  Substance and Sexual Activity  . Alcohol use: Yes    Comment: occasionally  . Drug use: No  . Sexual activity: Not on file  Lifestyle  . Physical activity:    Days per week: Not on file    Minutes per session: Not on file  . Stress: Not on file  Relationships  . Social connections:    Talks on phone: Not on file    Gets together: Not on file    Attends religious service: Not on file    Active member of club or organization: Not on file    Attends meetings of clubs or organizations: Not  on file    Relationship status: Not on file  Other Topics Concern  . Not on file  Social History Narrative  . Not on file    Vitals:   01/18/18 1401  BP: 120/78  Pulse: 66  Resp: 12  Temp: 98.1 F (36.7 C)  SpO2: 98%   Body mass index is 36.66 kg/m.   Physical Exam  Nursing note and vitals reviewed. Constitutional: He is oriented to person, place, and time. He appears well-developed. No distress.  HENT:  Head: Normocephalic and atraumatic.  Eyes: Conjunctivae are normal.  Cardiovascular: Normal rate and regular rhythm.  Pulses:      Posterior tibial pulses are 2+ on the right side, and 2+ on the left side.  Respiratory: Effort normal and breath sounds normal. No respiratory distress.  GI:  Soft. He exhibits no mass. There is no tenderness.  Musculoskeletal: He exhibits no edema.       Lumbar back: He exhibits decreased range of motion and tenderness. He exhibits no bony tenderness.       Back:  No significant deformity appreciated. There is tenderness upon palpation of lumbar paraspinal muscles. Pain elicited with movement on exam table during examination. No local edema or erythema appreciated, no suspicious lesions. Antalgic gait.  Neurological: He is alert and oriented to person, place, and time. He has normal strength. Coordination normal.  Reflex Scores:      Patellar reflexes are 2+ on the right side and 2+ on the left side. SLR negative, right one elicits right lower pain.  Skin: Skin is warm. No erythema.  Psychiatric: He has a normal mood and affect.  Well groomed,good eye contact.    ASSESSMENT AND PLAN:   Matthew Waters was seen today for back pain.  Diagnoses and all orders for this visit:  Acute right-sided low back pain without sciatica -     diclofenac (VOLTAREN) 75 MG EC tablet; Take 1 tablet (75 mg total) by mouth 2 (two) times daily as needed for up to 10 days. -     methocarbamol (ROBAXIN) 500 MG tablet; Take 1 tablet (500 mg total) by mouth every 8 (eight) hours as needed for up to 15 days for muscle spasms.  Muscle strain -     diclofenac (VOLTAREN) 75 MG EC tablet; Take 1 tablet (75 mg total) by mouth 2 (two) times daily as needed for up to 10 days. -     methocarbamol (ROBAXIN) 500 MG tablet; Take 1 tablet (500 mg total) by mouth every 8 (eight) hours as needed for up to 15 days for muscle spasms.   I do not think imaging is needed today. Here in the office after verbal consent he received Toradol 60 mg IM. He will continue with Diclofenac 75 mg bid as needed , instructed to start taking it in 8 hours. Side effects of medications discussed. Excuse note for work given. F/U as needed.   Return if symptoms worsen or fail to  improve.     Betty G. [place], MD  [facility]. [place] office.

## 2018-10-06 ENCOUNTER — Ambulatory Visit (INDEPENDENT_AMBULATORY_CARE_PROVIDER_SITE_OTHER): Payer: BLUE CROSS/BLUE SHIELD | Admitting: Sports Medicine

## 2018-10-06 ENCOUNTER — Encounter: Payer: Self-pay | Admitting: Sports Medicine

## 2018-10-06 ENCOUNTER — Ambulatory Visit: Payer: Self-pay

## 2018-10-06 VITALS — BP 108/62 | HR 64 | Wt 262.4 lb

## 2018-10-06 DIAGNOSIS — M25561 Pain in right knee: Secondary | ICD-10-CM

## 2018-10-06 DIAGNOSIS — G8929 Other chronic pain: Secondary | ICD-10-CM | POA: Diagnosis not present

## 2018-10-06 NOTE — Patient Instructions (Addendum)

## 2018-10-06 NOTE — Progress Notes (Signed)

## 2018-10-06 NOTE — Procedures (Signed)
PROCEDURE NOTE:  Ultrasound Guided: Injection: Right knee, Intra-articular Images were obtained and interpreted by myself, Gaspar Bidding, DO  Images have been saved and stored to PACS system. Images obtained on: GE S7 Ultrasound machine    ULTRASOUND FINDINGS:  There is mild degenerative spurring of both medial and lateral joint.  Degenerative bulging of the lateral meniscus is more pronounced than the medial meniscus.  Small supraphysiologic effusion.  DESCRIPTION OF PROCEDURE:  The patient's clinical condition is marked by substantial pain and/or significant functional disability. Other conservative therapy has not provided relief, is contraindicated, or not appropriate. There is a reasonable likelihood that injection will significantly improve the patient's pain and/or functional impairment.   After discussing the risks, benefits and expected outcomes of the injection and all questions were reviewed and answered, the patient wished to undergo the above named procedure.  Verbal consent was obtained.  The ultrasound was used to identify the target structure and adjacent neurovascular structures. The skin was then prepped in sterile fashion and the target structure was injected under direct visualization using sterile technique as below:  Single injection performed as below: PREP: Alcohol and Ethel Chloride APPROACH:superiolateral, single injection, 25g 1.5 in. INJECTATE: 2 cc 0.5% Marcaine and 2 cc 40mg /mL DepoMedrol ASPIRATE: None DRESSING: Band-Aid  Post procedural instructions including recommending icing and warning signs for infection were reviewed.    This procedure was well tolerated and there were no complications.   IMPRESSION: Succesful Ultrasound Guided: Injection

## 2018-10-06 NOTE — Progress Notes (Signed)
Matthew Waters. [name] at [facility] [phone]  Matthew Waters - 28 y.o. male MRN [id number]  Date of birth: Jan 24, 1991  Visit Date: October 09, 2018  PCP: [place], Betty G, MD   Referred by: [place], Betty G, MD  SUBJECTIVE:  Chief Complaint  Patient presents with  . New Patient (Initial Visit)    R knee pain     HPI: Patient presents with 2 years of intermittent right knee pain and swelling.  Seems to be worse after activities.  He has clicking popping catching and locking occasionally but no fully giving way.  The pain is currently rated as mild but does get up to severe after activities.  Has been progressively worsening over the past several years.  He is tried rest elevation and over-the-counter bracing without significant improvements.  Is tried ibuprofen Aleve and Tylenol.  He does report having a hyperextension injury in high school but no significant downtime from this other than a week or 2 of swelling and difficulty ambulating but full recovery  REVIEW OF SYSTEMS: No significant nighttime awakenings due to this issue. Denies fevers, chills, recent weight gain or weight loss.  No night sweats.  Pt denies any change in bowel or bladder habits, muscle weakness, numbness or falls associated with this pain. Otherwise 12 point review of systems performed and is negative   HISTORY:  Prior history reviewed and updated per electronic medical record.  Patient Active Problem List   Diagnosis Date Noted  . Chronic fatigue 01/12/2017  . Seborrheic dermatitis, unspecified 01/12/2017  . Class 2 obesity with body mass index (BMI) of 36.0 to 36.9 in adult 03/23/2016   Social History   Occupational History  . Not on file  Tobacco Use  . Smoking status: Never Smoker  . Smokeless tobacco: Never Used  Substance and Sexual Activity  . Alcohol use: Yes    Comment: occasionally  . Drug use: No  . Sexual activity: Not on file    Social History   Social History Narrative  . Not on file   Past Medical History:  Diagnosis Date  . Medical history non-contributory    No past surgical history on file. family history is negative for Colon cancer.  OBJECTIVE:  VS:  HT:    WT:262 lb 6.4 oz (119 kg)  BMI:     BP:108/62  HR:64bpm  TEMP: ( )  RESP:97 %   PHYSICAL EXAM: CONSTITUTIONAL: Well-developed, Well-nourished and In no acute distress EYES: Pupils are equal., EOM intact without nystagmus. and No scleral icterus. Psychiatric: Alert & appropriately interactive. and Not depressed or anxious appearing. EXTREMITY EXAM: Warm and well perfused  Right knee is overall well aligned without significant deformity.  He has a small amount of swelling and synovitis but this is mild.  He is ligamentously stable.  Negative McMurray's.  Pain with patellar grind and [place]ss[place] without overt clicking.   ASSESSMENT:   1. Chronic pain of right knee     PROCEDURES:  US Guided Injection per procedure note   Therapeutic exercises per procedure note      PLAN:  Pertinent additional documentation may be included in corresponding procedure notes, imaging studies, problem based documentation and patient instructions.  No problem-specific Assessment & Plan notes found for this encounter.   We will go ahead and inject the knee per procedure note and have them begin on hip and knee strenghtening exersises. Additionally we discussed the merits of compression and/or  bracing and recommend prophylatic compression with activity.  Icing discussed PRN.  If persistent ongoing symptoms can consider repeat injections and viscous supplementation.  Home Therapeutic exercises prescribed today per procedure note.  Activity modifications and the importance of avoiding exacerbating activities (limiting pain to no more than a 4 / 10 during or following activity) recommended and discussed.  Discussed red flag symptoms that warrant earlier  emergent evaluation and patient voices understanding.   No orders of the defined types were placed in this encounter.  Lab Orders  No laboratory test(s) ordered today    Imaging Orders     [place] [facility] POCT ULTRASOUND Referral Orders  No referral(s) requested today    Return in about 6 weeks (around 11/17/2018).  To ensure clinical resolution.  Consider advanced diagnostic imaging if any lack of improvement.         Andrena Mews, DO    [facility]

## 2018-10-09 ENCOUNTER — Encounter: Payer: Self-pay | Admitting: Sports Medicine

## 2018-11-17 ENCOUNTER — Ambulatory Visit: Payer: BLUE CROSS/BLUE SHIELD | Admitting: Sports Medicine

## 2019-07-07 DIAGNOSIS — J00 Acute nasopharyngitis [common cold]: Secondary | ICD-10-CM | POA: Diagnosis not present

## 2019-07-07 DIAGNOSIS — Z03818 Encounter for observation for suspected exposure to other biological agents ruled out: Secondary | ICD-10-CM | POA: Diagnosis not present

## 2019-12-05 ENCOUNTER — Encounter (HOSPITAL_COMMUNITY): Payer: Self-pay

## 2019-12-05 ENCOUNTER — Ambulatory Visit (HOSPITAL_COMMUNITY)
Admission: EM | Admit: 2019-12-05 | Discharge: 2019-12-05 | Disposition: A | Discharge status: Home / Self Care | Payer: HRSA Program | Attending: Family Medicine | Admitting: Family Medicine

## 2019-12-05 DIAGNOSIS — R05 Cough: Secondary | ICD-10-CM | POA: Diagnosis present

## 2019-12-05 DIAGNOSIS — Z882 Allergy status to sulfonamides status: Secondary | ICD-10-CM | POA: Diagnosis not present

## 2019-12-05 DIAGNOSIS — J069 Acute upper respiratory infection, unspecified: Secondary | ICD-10-CM | POA: Diagnosis not present

## 2019-12-05 DIAGNOSIS — U071 COVID-19: Secondary | ICD-10-CM | POA: Insufficient documentation

## 2019-12-05 MED ORDER — HYDROCODONE-HOMATROPINE 5-1.5 MG/5ML PO SYRP: 5.0000 mL | ORAL_SOLUTION | Freq: Four times a day (QID) | ORAL | 0 refills | Status: DC | PRN

## 2019-12-05 NOTE — Discharge Instructions (Addendum)
You have been tested for COVID-19 today. If your test returns positive, you will receive a phone call from [facility] regarding your results. Negative test results are not called. Both positive and negative results area always visible on MyChart. If you do not have a MyChart account, sign up instructions are provided in your discharge papers. Please do not hesitate to contact us should you have questions or concerns.  Be aware, your cough medication may cause drowsiness. Please do not drive, operate heavy machinery or make important decisions while on this medication, it can cloud your judgement.  

## 2019-12-05 NOTE — ED Provider Notes (Signed)
[facility]   [id number] 12/05/19 Arrival Time: 3295  ASSESSMENT & PLAN:  1. Viral URI with cough      COVID-19 testing sent. See letter/work note on file for self-isolation guidelines.  Meds ordered this encounter  Medications  . HYDROcodone-homatropine (HYCODAN) 5-1.5 MG/5ML syrup    Sig: Take 5 mLs by mouth every 6 (six) hours as needed for cough.    Dispense:  90 mL    Refill:  0     Follow-up Information    [facility].   Specialty: Urgent Care Why: As needed. Contact information: [place] [employer] [phone]            Discharge Instructions     You have been tested for COVID-19 today. If your test returns positive, you will receive a phone call from [facility] regarding your results. Negative test results are not called. Both positive and negative results area always visible on MyChart. If you do not have a MyChart account, sign up instructions are provided in your discharge papers. Please do not hesitate to contact us should you have questions or concerns.  Be aware, your cough medication may cause drowsiness. Please do not drive, operate heavy machinery or make important decisions while on this medication, it can cloud your judgement.      Reviewed expectations re: course of current medical issues. Questions answered. Outlined signs and symptoms indicating need for more acute intervention. Understanding verbalized. After Visit Summary given.   SUBJECTIVE: History from: patient. Matthew Waters is a 29 y.o. male who reports nasal congestion and coughing for the past two days. Non-productive cough; affecting sleep. Known COVID-19 contact: none but wife with similar symptoms. Recent travel: none. Denies: fever, difficulty breathing and headache. Normal PO intake without n/v/d.    OBJECTIVE:  Vitals:   12/05/19 1115 12/05/19 1116  BP: 127/80   Pulse: 87   Resp:  18   Temp: 98.8 F (37.1 C)   TempSrc: Oral   SpO2: 97%   Weight:  119.3 kg  Height:  5\' 11"  (1.803 m)    General appearance: alert; no distress Eyes: PERRLA; EOMI; conjunctiva normal HENT: [place]; AT; nasal mucosa normal; oral mucosa normal Neck: supple  CV: RRR Lungs: speaks full sentences without difficulty; unlabored; dry cough; lungs CTAB Extremities: no edema Skin: warm and dry Neurologic: normal gait Psychological: alert and cooperative; normal mood and affect  Labs:  Labs Reviewed  SARS CORONAVIRUS 2 (TAT 6-24 HRS)    Allergies  Allergen Reactions  . Sulfa Antibiotics Rash  . Sulfur     Past Medical History:  Diagnosis Date  . Medical history non-contributory    Social History   Socioeconomic History  . Marital status: Married    Spouse name: Not on file  . Number of children: Not on file  . Years of education: Not on file  . Highest education level: Not on file  Occupational History  . Not on file  Tobacco Use  . Smoking status: Never Smoker  . Smokeless tobacco: Never Used  Substance and Sexual Activity  . Alcohol use: Yes    Comment: occasionally  . Drug use: No  . Sexual activity: Not on file  Other Topics Concern  . Not on file  Social History Narrative  . Not on file   Social Determinants of Health   Financial Resource Strain:   . Difficulty of Paying Living Expenses:   Food Insecurity:   .  Worried About Programme researcher, broadcasting/film/video in the Last Year:   . Barista in the Last Year:   Transportation Needs:   . Freight forwarder (Medical):   Marland Kitchen Lack of Transportation (Non-Medical):   Physical Activity:   . Days of Exercise per Week:   . Minutes of Exercise per Session:   Stress:   . Feeling of Stress :   Social Connections:   . Frequency of Communication with Friends and Family:   . Frequency of Social Gatherings with Friends and Family:   . Attends Religious Services:   . Active Member of Clubs or Organizations:   . Attends Occupational hygienist Meetings:   Marland Kitchen Marital Status:   Intimate Partner Violence:   . Fear of Current or Ex-Partner:   . Emotionally Abused:   Marland Kitchen Physically Abused:   . Sexually Abused:    Family History  Problem Relation Age of Onset  . Colon cancer Neg Hx    History reviewed. No pertinent surgical history.   Mardella Layman, MD 12/05/19 1137

## 2019-12-05 NOTE — ED Triage Notes (Signed)
Pt c/o productive cough w/white mucous, SOB, nasal congestion, and chest congestionx2 days. Pt has non labored breathing. Skin color WNL. Lungs are clear.

## 2019-12-06 LAB — SARS CORONAVIRUS 2 (TAT 6-24 HRS): SARS Coronavirus 2: POSITIVE — AB

## 2019-12-07 ENCOUNTER — Encounter: Payer: Self-pay | Admitting: Physician Assistant

## 2019-12-07 ENCOUNTER — Telehealth: Payer: Self-pay | Admitting: Physician Assistant

## 2019-12-07 ENCOUNTER — Other Ambulatory Visit: Payer: Self-pay | Admitting: Adult Health

## 2019-12-07 DIAGNOSIS — U071 COVID-19: Secondary | ICD-10-CM

## 2019-12-07 NOTE — Progress Notes (Signed)
  I connected by phone with Matthew Waters on 12/07/2019 at 7:11 PM to discuss the potential use of an new treatment for mild to moderate COVID-19 viral infection in non-hospitalized patients.  This patient is a 29 y.o. male that meets the FDA criteria for Emergency Use Authorization of bamlanivimab/etesevimab or casirivimab/imdevimab.  Has a (+) direct SARS-CoV-2 viral test result  Has mild or moderate COVID-19   Is ? 29 years of age and weighs ? 40 kg  Is NOT hospitalized due to COVID-19  Is NOT requiring oxygen therapy or requiring an increase in baseline oxygen flow rate due to COVID-19  Is within 10 days of symptom onset  Has at least one of the high risk factor(s) for progression to severe COVID-19 and/or hospitalization as defined in EUA.  Specific high risk criteria : BMI >/= 35   No shortness of breath  Symptom onset 5 days ago   I have spoken and communicated the following to the patient or parent/caregiver:  1. FDA has authorized the emergency use of bamlanivimab/etesevimab and casirivimab\imdevimab for the treatment of mild to moderate COVID-19 in adults and pediatric patients with positive results of direct SARS-CoV-2 viral testing who are 1 years of age and older weighing at least 40 kg, and who are at high risk for progressing to severe COVID-19 and/or hospitalization.  2. The significant known and potential risks and benefits of bamlanivimab/etesevimab and casirivimab\imdevimab, and the extent to which such potential risks and benefits are unknown.  3. Information on available alternative treatments and the risks and benefits of those alternatives, including clinical trials.  4. Patients treated with bamlanivimab/etesevimab and casirivimab\imdevimab should continue to self-isolate and use infection control measures (e.g., wear mask, isolate, social distance, avoid sharing personal items, clean and disinfect "high touch" surfaces, and frequent handwashing) according to  CDC guidelines.   5. The patient or parent/caregiver has the option to accept or refuse bamlanivimab/etesevimab or casirivimab\imdevimab .  After reviewing this information with the patient, The patient agreed to proceed with receiving the bamlanimivab infusion and will be provided a copy of the Fact sheet prior to receiving the infusion.[name] 12/07/2019 7:11 PM

## 2019-12-07 NOTE — Telephone Encounter (Signed)
Called to discuss with patient about Covid symptoms and the use of bamlanivimab/etesevimab or casirivimab/imdevimab, a monoclonal antibody infusion for those with mild to moderate Covid symptoms and at a high risk of hospitalization.  Pt is qualified for this infusion at the [facility] infusion center due to BMI>35   Unable to leave a message bc VM not set up. I did send him a MyChart message.  Cline Crock PA-C  [facility]

## 2019-12-08 ENCOUNTER — Ambulatory Visit (HOSPITAL_COMMUNITY)
Admission: RE | Admit: 2019-12-08 | Discharge: 2019-12-08 | Disposition: A | Discharge status: Home / Self Care | Payer: HRSA Program | Source: Ambulatory Visit | Attending: Pulmonary Disease | Admitting: Pulmonary Disease

## 2019-12-08 DIAGNOSIS — U071 COVID-19: Secondary | ICD-10-CM

## 2019-12-08 MED ORDER — SODIUM CHLORIDE 0.9 % IV SOLN
Freq: Once | INTRAVENOUS | Status: AC
  Filled 2019-12-08: qty 20

## 2019-12-08 MED ORDER — ACETAMINOPHEN 325 MG PO TABS
650.0000 mg | ORAL_TABLET | Freq: Once | ORAL | Status: AC
  Administered 2019-12-08: 650 mg via ORAL
  Filled 2019-12-08: qty 2

## 2019-12-08 NOTE — Discharge Instructions (Signed)

## 2019-12-08 NOTE — Progress Notes (Signed)
  Diagnosis: COVID-19  Physician:wright  Procedure: Covid Infusion Clinic Med: bamlanivimab\etesevimab infusion - Provided patient with bamlanimivab\etesevimab fact sheet for patients, parents and caregivers prior to infusion.  Complications: No immediate complications noted.  Discharge: Discharged home   Shaune Spittle 12/08/2019  Patient ID: Matthew Waters, male   DOB: [birth year], 29 y.o.   MRN: [id number]

## 2020-07-30 ENCOUNTER — Encounter (HOSPITAL_COMMUNITY): Payer: Self-pay

## 2020-07-30 ENCOUNTER — Ambulatory Visit (HOSPITAL_COMMUNITY)
Admission: EM | Admit: 2020-07-30 | Discharge: 2020-07-30 | Disposition: A | Discharge status: Home / Self Care | Payer: HRSA Program | Attending: Family Medicine | Admitting: Family Medicine

## 2020-07-30 ENCOUNTER — Other Ambulatory Visit: Payer: Self-pay

## 2020-07-30 DIAGNOSIS — J029 Acute pharyngitis, unspecified: Secondary | ICD-10-CM | POA: Insufficient documentation

## 2020-07-30 DIAGNOSIS — J069 Acute upper respiratory infection, unspecified: Secondary | ICD-10-CM

## 2020-07-30 DIAGNOSIS — U071 COVID-19: Secondary | ICD-10-CM | POA: Diagnosis not present

## 2020-07-30 DIAGNOSIS — R059 Cough, unspecified: Secondary | ICD-10-CM | POA: Insufficient documentation

## 2020-07-30 DIAGNOSIS — R509 Fever, unspecified: Secondary | ICD-10-CM | POA: Diagnosis not present

## 2020-07-30 LAB — POCT RAPID STREP A, ED / UC: Streptococcus, Group A Screen (Direct): NEGATIVE

## 2020-07-30 MED ORDER — PROMETHAZINE-DM 6.25-15 MG/5ML PO SYRP: 5.0000 mL | ORAL_SOLUTION | Freq: Four times a day (QID) | ORAL | 0 refills | Status: DC | PRN

## 2020-07-30 NOTE — ED Provider Notes (Signed)
[facility]    CSN: [id number] Arrival date & time: 07/30/20  1032      History   Chief Complaint Chief Complaint  Patient presents with   Sore Throat   Generalized Body Aches   Chills    HPI Marke Goodwyn is a 29 y.o. male.   Here today with 3-4 days of sore throat, chills, body aches, fever, headaches, thick nasal congestion, cough. Denies CP, SOB, abdominal pain, N/V/D. So far taking mucinex with mild relief. No known sick contacts. Not UTD on vaccines for COVID and flu.      Past Medical History:  Diagnosis Date   Medical history non-contributory    Morbid obesity [facility])     Patient Active Problem List   Diagnosis Date Noted   Morbid obesity (HCC)    Chronic fatigue 01/12/2017   Seborrheic dermatitis, unspecified 01/12/2017   Class 2 obesity with body mass index (BMI) of 36.0 to 36.9 in adult 03/23/2016    History reviewed. No pertinent surgical history.     Home Medications    Prior to Admission medications   Medication Sig Start Date End Date Taking? Authorizing Provider  promethazine-dextromethorphan (PROMETHAZINE-DM) 6.25-15 MG/5ML syrup Take 5 mLs by mouth 4 (four) times daily as needed for cough. 07/30/20  Yes Particia Nearing, PA-C  HYDROcodone-homatropine [facility]) 5-1.5 MG/5ML syrup Take 5 mLs by mouth every 6 (six) hours as needed for cough. 12/05/19   Mardella Layman, MD  ketoconazole (NIZORAL) 2 % shampoo Apply 1 application topically 2 (two) times a week. 01/14/17   [place], Betty G, MD    Family History Family History  Problem Relation Age of Onset   Colon cancer Neg Hx     Social History Social History   Tobacco Use   Smoking status: Never Smoker   Smokeless tobacco: Never Used  Substance Use Topics   Alcohol use: Yes    Comment: occasionally   Drug use: No     Allergies   Sulfa antibiotics and Sulfur   Review of Systems Review of Systems PER HPI   Physical Exam Triage Vital Signs ED  Triage Vitals  Enc Vitals Group     BP 07/30/20 1143 116/68     Pulse Rate 07/30/20 1143 92     Resp 07/30/20 1143 17     Temp 07/30/20 1143 99.5 F (37.5 C)     Temp Source 07/30/20 1143 Oral     SpO2 07/30/20 1143 99 %     Weight --      Height --      Head Circumference --      Peak Flow --      Pain Score 07/30/20 1145 6     Pain Loc --      Pain Edu? --      Excl. in GC? --    No data found.  Updated Vital Signs BP 116/68 (BP Location: Right Arm)    Pulse 92    Temp 99.5 F (37.5 C) (Oral)    Resp 17    SpO2 99%   Visual Acuity Right Eye Distance:   Left Eye Distance:   Bilateral Distance:    Right Eye Near:   Left Eye Near:    Bilateral Near:     Physical Exam Vitals and nursing note reviewed.  Constitutional:      Appearance: Normal appearance.  HENT:     Head: Atraumatic.     Right Ear: Tympanic membrane normal.  Left Ear: Tympanic membrane normal.     Nose: Rhinorrhea present.     Mouth/Throat:     Mouth: Mucous membranes are moist.     Pharynx: Posterior oropharyngeal erythema present. No oropharyngeal exudate.  Eyes:     Extraocular Movements: Extraocular movements intact.     Conjunctiva/sclera: Conjunctivae normal.  Cardiovascular:     Rate and Rhythm: Normal rate and regular rhythm.  Pulmonary:     Effort: Pulmonary effort is normal. No respiratory distress.     Breath sounds: Normal breath sounds. No wheezing or rales.  Abdominal:     General: Bowel sounds are normal. There is no distension.     Palpations: Abdomen is soft.     Tenderness: There is no abdominal tenderness. There is no guarding.  Musculoskeletal:        General: Normal range of motion.     Cervical back: Normal range of motion and neck supple.  Skin:    General: Skin is warm and dry.  Neurological:     General: No focal deficit present.     Mental Status: He is oriented to person, place, and time.  Psychiatric:        Mood and Affect: Mood normal.        Thought  Content: Thought content normal.        Judgment: Judgment normal.      UC Treatments / Results  Labs (all labs ordered are listed, but only abnormal results are displayed) Labs Reviewed  CULTURE, GROUP A STREP ([facility])  RESP PANEL BY RT-PCR (FLU A&B, COVID) ARPGX2  POCT RAPID STREP A, ED / UC    EKG   Radiology No results found.  Procedures Procedures (including critical care time)  Medications Ordered in UC Medications - No data to display  Initial Impression / Assessment and Plan / UC Course  I have reviewed the triage vital signs and the nursing notes.  Pertinent labs & imaging results that were available during my care of the patient were reviewed by me and considered in my medical decision making (see chart for details).     Resp panel pending, discussed OTC supportive medications and home care, isolation, return precautions. Work note given.   Final Clinical Impressions(s) / UC Diagnoses   Final diagnoses:  Viral URI   Discharge Instructions   None    ED Prescriptions    Medication Sig Dispense Auth. Provider   promethazine-dextromethorphan (PROMETHAZINE-DM) 6.25-15 MG/5ML syrup Take 5 mLs by mouth 4 (four) times daily as needed for cough. 100 mL Particia Nearing, [place]     PDMP not reviewed this encounter.   Particia Nearing, [place] 07/30/20 1318

## 2020-07-30 NOTE — ED Triage Notes (Signed)
Pt presents with sore throat, chills, generalized body aches, and and congestion X 4 days.

## 2020-07-31 LAB — RESP PANEL BY RT-PCR (FLU A&B, COVID) ARPGX2
Influenza A by PCR: NEGATIVE
Influenza B by PCR: NEGATIVE
SARS Coronavirus 2 by RT PCR: POSITIVE — AB

## 2020-08-01 LAB — CULTURE, GROUP A STREP (THRC)

## 2022-03-06 ENCOUNTER — Encounter: Payer: Self-pay | Admitting: Emergency Medicine

## 2022-03-06 ENCOUNTER — Ambulatory Visit
Admission: EM | Admit: 2022-03-06 | Discharge: 2022-03-06 | Disposition: A | Discharge status: Home / Self Care | Payer: Self-pay | Attending: Urgent Care | Admitting: Urgent Care

## 2022-03-06 DIAGNOSIS — R300 Dysuria: Secondary | ICD-10-CM | POA: Insufficient documentation

## 2022-03-06 DIAGNOSIS — R350 Frequency of micturition: Secondary | ICD-10-CM | POA: Insufficient documentation

## 2022-03-06 DIAGNOSIS — L219 Seborrheic dermatitis, unspecified: Secondary | ICD-10-CM | POA: Insufficient documentation

## 2022-03-06 LAB — POCT URINALYSIS DIP (MANUAL ENTRY)
Bilirubin, UA: NEGATIVE
Blood, UA: NEGATIVE
Glucose, UA: NEGATIVE mg/dL
Ketones, POC UA: NEGATIVE mg/dL
Leukocytes, UA: NEGATIVE
Nitrite, UA: NEGATIVE
Protein Ur, POC: NEGATIVE mg/dL
Spec Grav, UA: 1.02 (ref 1.010–1.025)
Urobilinogen, UA: 0.2 E.U./dL
pH, UA: 6 (ref 5.0–8.0)

## 2022-03-06 MED ORDER — KETOCONAZOLE 2 % EX SHAM
1.0000 | MEDICATED_SHAMPOO | CUTANEOUS | 0 refills | Status: DC
Start: 2022-03-09 — End: 2022-11-29

## 2022-03-06 NOTE — Discharge Instructions (Signed)
Make sure you hydrate very well with plain water and a quantity of 80 ounces of water a day.  Please limit drinks that are considered urinary irritants such as soda, sweet tea, coffee, energy drinks, alcohol.  These can worsen your urinary and genital symptoms but also be the source of them.  I will let you know about your urine culture and penile swab results through MyChart to see if we need to prescribe or change your antibiotics based off of those results.  

## 2022-03-06 NOTE — ED Provider Notes (Signed)
[facility]   MRN: [id number] DOB: October 26, 1990  Subjective:   Matthew Waters is a 31 y.o. male presenting for recheck on urinary frequency and urgency.  Travels frequently for his work.  Has been doing a lot of sodas, coffee, energy drinks.  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  He is sexually active with 1 male partner, does not use condoms for protection.  Also wants a refill for his seborrheic dermatitis.  Uses ketoconazole shampoo with good relief.  No current facility-administered medications for this encounter.  Current Outpatient Medications:    HYDROcodone-homatropine (HYCODAN) 5-1.5 MG/5ML syrup, Take 5 mLs by mouth every 6 (six) hours as needed for cough., Disp: 90 mL, Rfl: 0   ketoconazole (NIZORAL) 2 % shampoo, Apply 1 application topically 2 (two) times a week., Disp: 120 mL, Rfl: 1   promethazine-dextromethorphan (PROMETHAZINE-DM) 6.25-15 MG/5ML syrup, Take 5 mLs by mouth 4 (four) times daily as needed for cough., Disp: 100 mL, Rfl: 0   Allergies  Allergen Reactions   Sulfa Antibiotics Rash   Elemental Sulfur     Past Medical History:  Diagnosis Date   Medical history non-contributory    Morbid obesity (HCC)      History reviewed. No pertinent surgical history.  Family History  Problem Relation Age of Onset   Colon cancer Neg Hx     Social History   Tobacco Use   Smoking status: Never   Smokeless tobacco: Never  Substance Use Topics   Alcohol use: Yes    Comment: occasionally   Drug use: No    ROS   Objective:   Vitals: BP 136/89   Pulse 80   Temp 98.1 F (36.7 C)   Resp 20   SpO2 98%   Physical Exam Constitutional:      General: He is not in acute distress.    Appearance: Normal appearance. He is well-developed and normal weight. He is not ill-appearing, toxic-appearing or diaphoretic.  HENT:     Head: Normocephalic and  atraumatic.     Right Ear: External ear normal.     Left Ear: External ear normal.     Nose: Nose normal.     Mouth/Throat:     Pharynx: Oropharynx is clear.  Eyes:     General: No scleral icterus.       Right eye: No discharge.        Left eye: No discharge.     Extraocular Movements: Extraocular movements intact.  Cardiovascular:     Rate and Rhythm: Normal rate.  Pulmonary:     Effort: Pulmonary effort is normal.  Abdominal:     General: Bowel sounds are normal. There is no distension.     Palpations: Abdomen is soft. There is no mass.     Tenderness: There is no abdominal tenderness. There is no right CVA tenderness, left CVA tenderness, guarding or rebound.  Musculoskeletal:     Cervical back: Normal range of motion.  Neurological:     Mental Status: He is alert and oriented to person, place, and time.  Psychiatric:        Mood and Affect: Mood normal.        Behavior: Behavior normal.        Thought Content: Thought content normal.        Judgment: Judgment normal.     Results for orders placed or performed  during the hospital encounter of 03/06/22 (from the past 24 hour(s))  POCT urinalysis dipstick     Status: Normal   Collection Time: 03/06/22  2:08 PM  Result Value Ref Range   Color, UA yellow yellow   Clarity, UA clear clear   Glucose, UA negative negative mg/dL   Bilirubin, UA negative negative   Ketones, POC UA negative negative mg/dL   Spec Grav, UA 0.626 9.485 - 1.025   Blood, UA negative negative   pH, UA 6.0 5.0 - 8.0   Protein Ur, POC negative negative mg/dL   Urobilinogen, UA 0.2 0.2 or 1.0 E.U./dL   Nitrite, UA Negative Negative   Leukocytes, UA Negative Negative    Assessment and Plan :   PDMP not reviewed this encounter.  1. Urinary frequency   2. Dysuria   3. Seborrheic dermatitis, unspecified   4. Seborrheic dermatitis    We will base treatment off of his lab results, urine culture or cytology.  Advised that he avoid urinary irritants  specifically his caffeine, energy drinks, sodas.  Hydrate primarily with water.  At discharge, he did request a refill of his ketoconazole shampoo which I provided to him. Counseled patient on potential for adverse effects with medications prescribed/recommended today, ER and return-to-clinic precautions discussed, patient verbalized understanding.    Wallis Bamberg, [place] 03/06/22 1657

## 2022-03-06 NOTE — ED Triage Notes (Signed)
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 Pt has been drinking more water and cutting back on sodas this week. Pt would like a recheck of his urine,.

## 2022-03-08 LAB — URINE CULTURE: Culture: NO GROWTH

## 2022-03-09 LAB — CYTOLOGY, (ORAL, ANAL, URETHRAL) ANCILLARY ONLY
Chlamydia: NEGATIVE
Comment: NEGATIVE
Comment: NEGATIVE
Comment: NORMAL
Neisseria Gonorrhea: NEGATIVE
Trichomonas: NEGATIVE

## 2022-11-29 ENCOUNTER — Ambulatory Visit (HOSPITAL_COMMUNITY)
Admission: EM | Admit: 2022-11-29 | Discharge: 2022-11-29 | Disposition: A | Discharge status: Home / Self Care | Payer: BLUE CROSS/BLUE SHIELD | Attending: Emergency Medicine | Admitting: Emergency Medicine

## 2022-11-29 ENCOUNTER — Encounter (HOSPITAL_COMMUNITY): Payer: Self-pay | Admitting: Emergency Medicine

## 2022-11-29 DIAGNOSIS — L219 Seborrheic dermatitis, unspecified: Secondary | ICD-10-CM | POA: Diagnosis not present

## 2022-11-29 MED ORDER — KETOCONAZOLE 2 % EX SHAM: 1.0000 | MEDICATED_SHAMPOO | CUTANEOUS | 1 refills | Status: AC

## 2022-11-29 NOTE — Discharge Instructions (Addendum)
I have refilled your ketoconazole shampoo.  I suggest you establish with a primary care provider so they can provide you with continuous refills and routine care.  Please return to clinic for any new or concerning symptoms.

## 2022-11-29 NOTE — ED Provider Notes (Signed)
[facility]    CSN: [id number] Arrival date & time: 11/29/22  1052      History   Chief Complaint Chief Complaint  Patient presents with   Skin Problem    HPI Matthew Waters is a 32 y.o. male.   Patient presents to clinic for ongoing seborrheic dermatitis to his face.  Reports he needs a refill of his ketoconazole shampoo, has been out of this for a while.  Does not have a primary care provider.  Denies recent illness, cough, shortness of breath, or any other issues.  The history is provided by the patient and medical records.    Past Medical History:  Diagnosis Date   Medical history non-contributory    Morbid obesity [facility])     Patient Active Problem List   Diagnosis Date Noted   Morbid obesity (HCC)    Chronic fatigue 01/12/2017   Seborrheic dermatitis, unspecified 01/12/2017   Class 2 obesity with body mass index (BMI) of 36.0 to 36.9 in adult 03/23/2016    History reviewed. No pertinent surgical history.     Home Medications    Prior to Admission medications   Medication Sig Start Date End Date Taking? Authorizing Provider  ketoconazole (NIZORAL) 2 % shampoo Apply 1 Application topically 2 (two) times a week. 11/30/22   Imonie Tuch, [place], FNP    Family History Family History  Problem Relation Age of Onset   Colon cancer Neg Hx     Social History Social History   Tobacco Use   Smoking status: Never   Smokeless tobacco: Never  Substance Use Topics   Alcohol use: Yes    Comment: occasionally   Drug use: No     Allergies   Sulfa antibiotics and Elemental sulfur   Review of Systems Review of Systems  Skin:  Positive for rash.     Physical Exam Triage Vital Signs ED Triage Vitals  Enc Vitals Group     BP 11/29/22 1136 119/75     Pulse Rate 11/29/22 1136 60     Resp 11/29/22 1136 14     Temp 11/29/22 1136 97.9 F (36.6 C)     Temp src --      SpO2 11/29/22 1136 97 %     Weight --      Height --      Head  Circumference --      Peak Flow --      Pain Score 11/29/22 1135 0     Pain Loc --      Pain Edu? --      Excl. in GC? --    No data found.  Updated Vital Signs BP 119/75 (BP Location: Right Arm)   Pulse 60   Temp 97.9 F (36.6 C)   Resp 14   SpO2 97%   Visual Acuity Right Eye Distance:   Left Eye Distance:   Bilateral Distance:    Right Eye Near:   Left Eye Near:    Bilateral Near:     Physical Exam Vitals and nursing note reviewed.  Constitutional:      Appearance: Normal appearance.  HENT:     Head: Normocephalic and atraumatic.     Right Ear: External ear normal.     Left Ear: External ear normal.     Nose: Nose normal.     Mouth/Throat:     Mouth: Mucous membranes are moist.  Eyes:     General: No scleral icterus.    Conjunctiva/sclera:  Conjunctivae normal.  Cardiovascular:     Rate and Rhythm: Normal rate and regular rhythm.  Pulmonary:     Effort: Pulmonary effort is normal. No respiratory distress.  Musculoskeletal:        General: Normal range of motion.  Skin:    General: Skin is warm and dry.     Findings: Rash present.  Neurological:     Mental Status: He is alert and oriented to person, place, and time.  Psychiatric:        Mood and Affect: Mood normal.        Behavior: Behavior normal.      UC Treatments / Results  Labs (all labs ordered are listed, but only abnormal results are displayed) Labs Reviewed - No data to display  EKG   Radiology No results found.  Procedures Procedures (including critical care time)  Medications Ordered in UC Medications - No data to display  Initial Impression / Assessment and Plan / UC Course  I have reviewed the triage vital signs and the nursing notes.  Pertinent labs & imaging results that were available during my care of the patient were reviewed by me and considered in my medical decision making (see chart for details).  Vitals and triage reviewed, patient is hemodynamically stable.   Requesting refill of ketoconazole shampoo for facial seborrheic dermatitis.  Will refill.  Dry scaling rash to cheeks and eyebrows.  Attempted to schedule with a primary care provider in clinic, patient declined.  Advised her to do this online at home for further routine care.  Patient verbalized understanding, no questions at this time.      Final Clinical Impressions(s) / UC Diagnoses   Final diagnoses:  Seborrheic dermatitis     Discharge Instructions      I have refilled your ketoconazole shampoo.  I suggest you establish with a primary care provider so they can provide you with continuous refills and routine care.  Please return to clinic for any new or concerning symptoms.    ED Prescriptions     Medication Sig Dispense Auth. Provider   ketoconazole (NIZORAL) 2 % shampoo Apply 1 Application topically 2 (two) times a week. 120 mL Kristinia Leavy, [place], [place]      PDMP not reviewed this encounter.   Jeryn Cerney, [place], [place] 11/29/22 1153

## 2022-11-29 NOTE — ED Triage Notes (Signed)
Pt reports he has chronic dermatitis and needing the shampoo for his skin
